# Patient Record
Sex: Male | Born: 1980 | Race: White | Hispanic: Yes | Smoking: Never smoker
Health system: Southern US, Community
[De-identification: ages and names within clinical notes are randomized; demographics above are authoritative.]

## PROBLEM LIST (undated history)

## (undated) DIAGNOSIS — I10 Essential (primary) hypertension: Secondary | ICD-10-CM

## (undated) DIAGNOSIS — A749 Chlamydial infection, unspecified: Secondary | ICD-10-CM

---

## 2007-06-27 ENCOUNTER — Ambulatory Visit: Payer: Self-pay | Admitting: Internal Medicine

## 2007-07-17 ENCOUNTER — Ambulatory Visit: Payer: Self-pay | Admitting: Family Medicine

## 2007-08-06 ENCOUNTER — Encounter: Admission: RE | Admit: 2007-08-06 | Discharge: 2007-08-07 | Payer: Self-pay | Admitting: Internal Medicine

## 2007-08-13 ENCOUNTER — Encounter: Admission: RE | Admit: 2007-08-13 | Discharge: 2007-08-21 | Payer: Self-pay | Admitting: Internal Medicine

## 2008-02-23 ENCOUNTER — Emergency Department (HOSPITAL_COMMUNITY): Admission: EM | Admit: 2008-02-23 | Discharge: 2008-02-23 | Payer: Self-pay | Admitting: Emergency Medicine

## 2008-06-26 ENCOUNTER — Emergency Department (HOSPITAL_COMMUNITY): Admission: EM | Admit: 2008-06-26 | Discharge: 2008-06-26 | Payer: Self-pay | Admitting: Emergency Medicine

## 2009-01-16 ENCOUNTER — Emergency Department (HOSPITAL_COMMUNITY): Admission: EM | Admit: 2009-01-16 | Discharge: 2009-01-17 | Payer: Self-pay | Admitting: Emergency Medicine

## 2009-04-05 ENCOUNTER — Emergency Department (HOSPITAL_COMMUNITY): Admission: EM | Admit: 2009-04-05 | Discharge: 2009-04-06 | Payer: Self-pay | Admitting: Emergency Medicine

## 2009-09-12 ENCOUNTER — Emergency Department (HOSPITAL_COMMUNITY): Admission: EM | Admit: 2009-09-12 | Discharge: 2009-09-13 | Payer: Self-pay | Admitting: Emergency Medicine

## 2010-02-05 ENCOUNTER — Emergency Department (HOSPITAL_COMMUNITY): Admission: EM | Admit: 2010-02-05 | Discharge: 2010-02-05 | Payer: Self-pay | Admitting: Emergency Medicine

## 2010-10-24 LAB — GC/CHLAMYDIA PROBE AMP, GENITAL: GC Probe Amp, Genital: NEGATIVE

## 2010-10-27 ENCOUNTER — Other Ambulatory Visit (HOSPITAL_COMMUNITY): Payer: Self-pay | Admitting: Internal Medicine

## 2010-10-27 ENCOUNTER — Ambulatory Visit (HOSPITAL_COMMUNITY)
Admission: RE | Admit: 2010-10-27 | Discharge: 2010-10-27 | Disposition: A | Discharge status: Home / Self Care | Payer: Self-pay | Source: Ambulatory Visit | Attending: Internal Medicine | Admitting: Internal Medicine

## 2010-10-27 DIAGNOSIS — R0602 Shortness of breath: Secondary | ICD-10-CM | POA: Insufficient documentation

## 2010-10-27 DIAGNOSIS — R05 Cough: Secondary | ICD-10-CM

## 2010-10-27 DIAGNOSIS — I10 Essential (primary) hypertension: Secondary | ICD-10-CM | POA: Insufficient documentation

## 2010-10-27 DIAGNOSIS — R042 Hemoptysis: Secondary | ICD-10-CM | POA: Insufficient documentation

## 2010-11-13 LAB — URINE MICROSCOPIC-ADD ON

## 2010-11-13 LAB — URINALYSIS, ROUTINE W REFLEX MICROSCOPIC
Bilirubin Urine: NEGATIVE
Glucose, UA: NEGATIVE mg/dL
Ketones, ur: NEGATIVE mg/dL
pH: 6 (ref 5.0–8.0)

## 2010-11-15 LAB — URINE CULTURE: Colony Count: 60000

## 2010-11-15 LAB — URINE MICROSCOPIC-ADD ON

## 2010-11-15 LAB — URINALYSIS, ROUTINE W REFLEX MICROSCOPIC
Bilirubin Urine: NEGATIVE
Glucose, UA: NEGATIVE mg/dL
Ketones, ur: NEGATIVE mg/dL
Nitrite: NEGATIVE
Protein, ur: 30 mg/dL — AB
Specific Gravity, Urine: 1.031 — ABNORMAL HIGH (ref 1.005–1.030)
Urobilinogen, UA: 1 mg/dL (ref 0.0–1.0)
pH: 6.5 (ref 5.0–8.0)

## 2011-05-06 LAB — URINALYSIS, ROUTINE W REFLEX MICROSCOPIC
Bilirubin Urine: NEGATIVE
Glucose, UA: NEGATIVE
Hgb urine dipstick: NEGATIVE
Ketones, ur: 15 — AB
Protein, ur: NEGATIVE

## 2013-07-15 ENCOUNTER — Emergency Department (HOSPITAL_COMMUNITY)
Admission: EM | Admit: 2013-07-15 | Discharge: 2013-07-16 | Disposition: A | Discharge status: Home / Self Care | Payer: Self-pay | Attending: Emergency Medicine | Admitting: Emergency Medicine

## 2013-07-15 ENCOUNTER — Encounter (HOSPITAL_COMMUNITY): Payer: Self-pay | Admitting: Emergency Medicine

## 2013-07-15 DIAGNOSIS — R319 Hematuria, unspecified: Secondary | ICD-10-CM | POA: Insufficient documentation

## 2013-07-15 DIAGNOSIS — Z8619 Personal history of other infectious and parasitic diseases: Secondary | ICD-10-CM | POA: Insufficient documentation

## 2013-07-15 DIAGNOSIS — R300 Dysuria: Secondary | ICD-10-CM | POA: Insufficient documentation

## 2013-07-15 LAB — URINALYSIS, ROUTINE W REFLEX MICROSCOPIC
Glucose, UA: NEGATIVE mg/dL
Nitrite: NEGATIVE
Specific Gravity, Urine: 1.013 (ref 1.005–1.030)
pH: 7 (ref 5.0–8.0)

## 2013-07-15 LAB — URINE MICROSCOPIC-ADD ON

## 2013-07-15 NOTE — ED Notes (Signed)
Pt. reports hematuria this evening with slight dysuria , pt. endorsed unprotected sex with a male that is on her period last night .

## 2013-07-16 LAB — GC/CHLAMYDIA PROBE AMP: CT Probe RNA: POSITIVE — AB

## 2013-07-16 MED ORDER — DOXYCYCLINE HYCLATE 100 MG PO CAPS: 100.0000 mg | ORAL_CAPSULE | Freq: Two times a day (BID) | ORAL | Status: DC

## 2013-07-16 MED ORDER — AZITHROMYCIN 250 MG PO TABS
1000.0000 mg | ORAL_TABLET | Freq: Once | ORAL | Status: AC
  Administered 2013-07-16: 1000 mg via ORAL
  Filled 2013-07-16: qty 4

## 2013-07-16 MED ORDER — LIDOCAINE HCL (PF) 1 % IJ SOLN
INTRAMUSCULAR | Status: AC
  Filled 2013-07-16: qty 5

## 2013-07-16 MED ORDER — CEFTRIAXONE SODIUM 1 G IJ SOLR
1.0000 g | Freq: Once | INTRAMUSCULAR | Status: AC
  Administered 2013-07-16: 1 g via INTRAMUSCULAR
  Filled 2013-07-16: qty 10

## 2013-07-16 NOTE — ED Provider Notes (Signed)
CSN: [id number]     Arrival date & time 07/15/13  2027 History   First MD Initiated Contact with Patient 07/15/13 2339     Chief Complaint  Patient presents with  . Hematuria   (Consider location/radiation/quality/duration/timing/severity/associated sxs/prior Treatment) HPI History per patient. Hematuria with slight dysuria onset tonight. One episode. No discharge. Has history of herpes and gonorrhea. Has had recent unprotected sexual intercourse. No GU lesions. No testicle pain. No penile pain. No back pain. No recent fever chills. Denies any sore throat. No rash. No history of same. No flank pain. Symptoms moderate in severity.  History reviewed. No pertinent past medical history. History reviewed. No pertinent past surgical history. No family history on file. History  Substance Use Topics  . Smoking status: Never Smoker   . Smokeless tobacco: Not on file  . Alcohol Use: No    Review of Systems  Constitutional: Negative for fever and chills.  Eyes: Negative for redness.  Respiratory: Negative for shortness of breath.   Cardiovascular: Negative for chest pain.  Gastrointestinal: Negative for vomiting and abdominal pain.  Genitourinary: Positive for dysuria and hematuria.  Musculoskeletal: Negative for back pain, neck pain and neck stiffness.  Skin: Negative for rash.  Neurological: Negative for headaches.  All other systems reviewed and are negative.    Allergies  Review of patient's allergies indicates no known allergies.  Home Medications  No current outpatient prescriptions on file. BP 157/105  Pulse 81  Temp(Src) 98.9 F (37.2 C) (Oral)  Resp 16  Ht 5\' 7"  (1.702 m)  Wt 182 lb 8 oz (82.781 kg)  BMI 28.58 kg/m2  SpO2 100% Physical Exam  Constitutional: He is oriented to person, place, and time. He appears well-developed and well-nourished.  HENT:  Head: Normocephalic and atraumatic.  Mouth/Throat: Oropharynx is clear and moist. No oropharyngeal exudate.  Eyes:  EOM are normal. Pupils are equal, round, and reactive to light.  Neck: Neck supple.  Cardiovascular: Regular rhythm and intact distal pulses.   Pulmonary/Chest: Effort normal. No respiratory distress.  Genitourinary:  Uncircumcised, no GU rash or lesions. No testicle tenderness. No penile drip.  Musculoskeletal: Normal range of motion. He exhibits no edema.  Neurological: He is alert and oriented to person, place, and time.  Skin: Skin is warm and dry. No rash noted.    ED Course  Procedures (including critical care time) Labs Review Labs Reviewed  URINALYSIS, ROUTINE W REFLEX MICROSCOPIC - Abnormal; Notable for the following:    Hgb urine dipstick LARGE (*)    Leukocytes, UA TRACE (*)    All other components within normal limits  GC/CHLAMYDIA PROBE AMP  URINE MICROSCOPIC-ADD ON  RPR  HIV ANTIBODY (ROUTINE TESTING)   GU probe sent for testing. Patient requesting HIV testing - he agrees to followup with health department in 2 days for results including RPR.  Rocephin and azithromycin provided in the ER  Urology referral provided. Prescription for antibiotics. STD precautions provided  Stable for discharge home  MDM  Diagnosis: Hematuria  32 year old male with history of STD in unprotected intercourse. No infectious symptoms. Medications provided Vital signs and nurse's notes reviewed and considered  Sunnie Nielsen, MD 07/16/13 [phone]

## 2013-07-18 NOTE — ED Notes (Signed)
+   Chlamydia Patient treated with Rocephin And Zithromax-[facility] faxed 

## 2013-07-19 ENCOUNTER — Telehealth (HOSPITAL_COMMUNITY): Payer: Self-pay | Admitting: Emergency Medicine

## 2014-08-16 ENCOUNTER — Encounter (HOSPITAL_COMMUNITY): Payer: Self-pay | Admitting: *Deleted

## 2014-08-16 ENCOUNTER — Emergency Department (HOSPITAL_COMMUNITY)
Admission: EM | Admit: 2014-08-16 | Discharge: 2014-08-16 | Disposition: A | Discharge status: Home / Self Care | Payer: Self-pay | Attending: Emergency Medicine | Admitting: Emergency Medicine

## 2014-08-16 DIAGNOSIS — Z711 Person with feared health complaint in whom no diagnosis is made: Secondary | ICD-10-CM

## 2014-08-16 DIAGNOSIS — R369 Urethral discharge, unspecified: Secondary | ICD-10-CM | POA: Insufficient documentation

## 2014-08-16 DIAGNOSIS — R300 Dysuria: Secondary | ICD-10-CM | POA: Insufficient documentation

## 2014-08-16 DIAGNOSIS — Z202 Contact with and (suspected) exposure to infections with a predominantly sexual mode of transmission: Secondary | ICD-10-CM | POA: Insufficient documentation

## 2014-08-16 DIAGNOSIS — Z792 Long term (current) use of antibiotics: Secondary | ICD-10-CM | POA: Insufficient documentation

## 2014-08-16 MED ORDER — CEFTRIAXONE SODIUM 250 MG IJ SOLR
250.0000 mg | Freq: Once | INTRAMUSCULAR | Status: AC
  Administered 2014-08-16: 250 mg via INTRAMUSCULAR
  Filled 2014-08-16: qty 250

## 2014-08-16 MED ORDER — LIDOCAINE HCL (PF) 1 % IJ SOLN
INTRAMUSCULAR | Status: AC
  Administered 2014-08-16: 0.9 mL
  Filled 2014-08-16: qty 5

## 2014-08-16 MED ORDER — AZITHROMYCIN 250 MG PO TABS
1000.0000 mg | ORAL_TABLET | Freq: Once | ORAL | Status: AC
  Administered 2014-08-16: 1000 mg via ORAL
  Filled 2014-08-16: qty 4

## 2014-08-16 NOTE — ED Notes (Signed)
Pt in c/o penile discharge and pain with urination, concerned he has a STD

## 2014-08-16 NOTE — ED Provider Notes (Signed)
CSN: [id number]     Arrival date & time 08/16/14  1756 History  This chart was scribed for Cameron Wise, working with Juliet RudeNathan R. Rubin PayorPickering, MD, by Roxy Cedarhandni Bhalodia ED Scribe. This patient was seen in room TR11C/TR11C and the patient's care was started at 7:31 PM  Chief Complaint  Patient presents with  . SEXUALLY TRANSMITTED DISEASE   The history is provided by the patient. No language interpreter was used.   HPI Comments: Cameron Wise is a 34 y.o. male who presents to the Emergency Department complaining of moderate dysuria and penile discharge that began 1 week ago. Patient is concerned that he may have a STD. Patient states that he had unprotected sex recently. Patient has no known allergies. Denies fever, chills, n/v/d.  History reviewed. No pertinent past medical history. History reviewed. No pertinent past surgical history. History reviewed. No pertinent family history. History  Substance Use Topics  . Smoking status: Never Smoker   . Smokeless tobacco: Not on file  . Alcohol Use: No   Review of Systems  Genitourinary: Positive for dysuria and discharge.  All other systems reviewed and are negative.  Allergies  Review of patient's allergies indicates no known allergies.  Home Medications   Prior to Admission medications   Medication Sig Start Date End Date Taking? Authorizing Provider  doxycycline (VIBRAMYCIN) 100 MG capsule Take 1 capsule (100 mg total) by mouth 2 (two) times daily. 07/16/13   Sunnie NielsenBrian Opitz, MD   Triage Vitals: BP 157/105 mmHg  Pulse 90  Temp(Src) 98.3 F (36.8 C) (Oral)  Resp 18  SpO2 97%  Physical Exam  Constitutional: He appears well-developed and well-nourished.  HENT:  Head: Normocephalic and atraumatic.  Eyes: Conjunctivae are normal. No scleral icterus.  Neck: Normal range of motion.  Cardiovascular: Normal rate, regular rhythm and normal heart sounds.   Pulmonary/Chest: Effort normal and breath sounds normal. No respiratory distress. He has  no wheezes. He has no rales. He exhibits no tenderness.  Abdominal: Soft. Bowel sounds are normal. He exhibits no distension and no mass. There is no tenderness. There is no rebound and no guarding.  Genitourinary:  Chaperoned exam. Penis is uncircumcised. No erythema or rashes. No discharge.  Musculoskeletal: Normal range of motion.  Neurological: He is alert.  Skin: Skin is warm and dry.  Nursing note and vitals reviewed.  ED Course  Procedures (including critical care time)  DIAGNOSTIC STUDIES: Oxygen Saturation is 97% on RA, adequate by my interpretation.    COORDINATION OF CARE:  Pt advised of plan for treatment and pt agrees.  Labs Review Labs Reviewed  GC/CHLAMYDIA PROBE AMP  HIV ANTIBODY (ROUTINE TESTING)  RPR   Imaging Review No results found.   EKG Interpretation None     MDM   Final diagnoses:  Concern about STD in male without diagnosis  Penile discharge  Dysuria    Pt tx empirically for STD. Home care instructions provided. Return precautions provided. Pt verbalized understanding and agreement with tx plan.   I personally performed the services described in this documentation, which was scribed in my presence. The recorded information has been reviewed and is accurate.  Cameron Finnerrin O'Malley, PA-C 08/16/14 2102  Juliet RudeNathan R. Rubin PayorPickering, MD 08/16/14 [phone]

## 2014-08-18 LAB — GC/CHLAMYDIA PROBE AMP
CT Probe RNA: NEGATIVE
GC Probe RNA: NEGATIVE

## 2014-08-18 LAB — RPR: RPR Ser Ql: NONREACTIVE

## 2014-08-18 LAB — HIV ANTIBODY (ROUTINE TESTING W REFLEX)
HIV 1/O/2 Abs-Index Value: <1 (ref <1.00)
HIV-1/HIV-2 Ab: NONREACTIVE

## 2014-09-06 ENCOUNTER — Encounter (HOSPITAL_COMMUNITY): Payer: Self-pay | Admitting: *Deleted

## 2014-09-06 ENCOUNTER — Emergency Department (HOSPITAL_COMMUNITY)
Admission: EM | Admit: 2014-09-06 | Discharge: 2014-09-06 | Disposition: A | Discharge status: Home / Self Care | Payer: Self-pay | Attending: Emergency Medicine | Admitting: Emergency Medicine

## 2014-09-06 DIAGNOSIS — Z202 Contact with and (suspected) exposure to infections with a predominantly sexual mode of transmission: Secondary | ICD-10-CM | POA: Insufficient documentation

## 2014-09-06 DIAGNOSIS — Z792 Long term (current) use of antibiotics: Secondary | ICD-10-CM | POA: Insufficient documentation

## 2014-09-06 DIAGNOSIS — Z711 Person with feared health complaint in whom no diagnosis is made: Secondary | ICD-10-CM

## 2014-09-06 LAB — URINALYSIS, ROUTINE W REFLEX MICROSCOPIC
Bilirubin Urine: NEGATIVE
Glucose, UA: NEGATIVE mg/dL
Hgb urine dipstick: NEGATIVE
Ketones, ur: NEGATIVE mg/dL
LEUKOCYTES UA: NEGATIVE
NITRITE: NEGATIVE
Protein, ur: NEGATIVE mg/dL
SPECIFIC GRAVITY, URINE: 1.028 (ref 1.005–1.030)
Urobilinogen, UA: 1 mg/dL (ref 0.0–1.0)
pH: 7 (ref 5.0–8.0)

## 2014-09-06 MED ORDER — CEFTRIAXONE SODIUM 250 MG IJ SOLR
250.0000 mg | Freq: Once | INTRAMUSCULAR | Status: AC
  Administered 2014-09-06: 250 mg via INTRAMUSCULAR
  Filled 2014-09-06: qty 250

## 2014-09-06 MED ORDER — AZITHROMYCIN 250 MG PO TABS
1000.0000 mg | ORAL_TABLET | Freq: Once | ORAL | Status: AC
  Administered 2014-09-06: 1000 mg via ORAL
  Filled 2014-09-06: qty 4

## 2014-09-06 MED ORDER — LIDOCAINE HCL (PF) 1 % IJ SOLN
INTRAMUSCULAR | Status: AC
  Administered 2014-09-06: 1 mL
  Filled 2014-09-06: qty 5

## 2014-09-06 NOTE — Discharge Instructions (Signed)
Safe Sex Safe sex is about reducing the risk of giving or getting a sexually transmitted disease (STD). STDs are spread through sexual contact involving the genitals, mouth, or rectum. Some STDs can be cured and others cannot. Safe sex can also prevent unintended pregnancies.  WHAT ARE SOME SAFE SEX PRACTICES?  Limit your sexual activity to only one partner who is having sex with only you.  Talk to your partner about his or her past partners, past STDs, and drug use.  Use a condom every time you have sexual intercourse. This includes vaginal, oral, and anal sexual activity. Both females and males should wear condoms during oral sex. Only use latex or polyurethane condoms and water-based lubricants. Using petroleum-based lubricants or oils to lubricate a condom will weaken the condom and increase the chance that it will break. The condom should be in place from the beginning to the end of sexual activity. Wearing a condom reduces, but does not completely eliminate, your risk of getting or giving an STD. STDs can be spread by contact with infected body fluids and skin.  Get vaccinated for hepatitis B and HPV.  Avoid alcohol and recreational drugs, which can affect your judgment. You may forget to use a condom or participate in high-risk sex.  For females, avoid douching after sexual intercourse. Douching can spread an infection farther into the reproductive tract.  Check your body for signs of sores, blisters, rashes, or unusual discharge. See your health care provider if you notice any of these signs.  Avoid sexual contact if you have symptoms of an infection or are being treated for an STD. If you or your partner has herpes, avoid sexual contact when blisters are present. Use condoms at all other times.  If you are at risk of being infected with HIV, it is recommended that you take a prescription medicine daily to prevent HIV infection. This is called pre-exposure prophylaxis (PrEP). You are  considered at risk if:  You are a man who has sex with other men (MSM).  You are a heterosexual man or woman who is sexually active with more than one partner.  You take drugs by injection.  You are sexually active with a partner who has HIV.  Talk with your health care provider about whether you are at high risk of being infected with HIV. If you choose to begin PrEP, you should first be tested for HIV. You should then be tested every 3 months for as long as you are taking PrEP.  See your health care provider for regular screenings, exams, and tests for other STDs. Before having sex with a new partner, each of you should be screened for STDs and should talk about the results with each other. WHAT ARE THE BENEFITS OF SAFE SEX?   There is less chance of getting or giving an STD.  You can prevent unwanted or unintended pregnancies.  By discussing safe sex concerns with your partner, you may increase feelings of intimacy, comfort, trust, and honesty between the two of you. Document Released: 09/01/2004 Document Revised: 12/09/2013 Document Reviewed: 01/16/2012 [facility] Patient Information 2015 [place], [place]. This information is not intended to replace advice given to you by your health care provider. Make sure you discuss any questions you have with your health care provider.  Sexo seguro (Safe Sex) El sexo seguro implica reducir el riesgo de transmitir o contagiarse enfermedades de transmisin sexual (ETS). Las ETS se transmiten por contacto sexual con los genitales, la boca o el recto.  Algunas ETS pueden curarse, otras no. El sexo seguro tambin puede prevenir los embarazos no deseados.  CULES SON ALGUNAS DE LAS PRCTICAS DE SEXO SEGURO?  Limite las [address] Placeactividades sexuales a una sola pareja, que tenga relaciones sexuales solamente con usted.  Hable con su pareja sobre sus parejas pasadas, antecedentes de ETS y el uso de drogas.  Use un condn cada vez que tenga The [place] Paul Travelersrelaciones sexuales.  Esto abarca la actividad sexual vaginal, oral y anal. Tanto los hombres como las mujeres deben usar condn durante el sexo oral. Use solamente condones de ltex o poliuretano y lubricantes a base de agua. La utilizacin de aceites o lubricantes a base de vaselina para lubricar el condn podran debilitarlo y [address] Dra[address]ar las posibilidades de que se rompa. El condn debe estar colocado desde el principio hasta el final de la actividad sexual. Usar un condn reduce el riesgo de transmitir o contagiarse una ETS, pero no lo elimina por completo. Las ETS pueden trasmitirse por contacto con los fluidos corporales y la piel infectados.  Aplquese las vacunas para la hepatitisB y [place] [facility].  Evite el uso de alcohol y drogas porque pueden afectar su criterio. Tal vez se olvide de usar un condn o participe en actividades sexuales de alto riesgo.  En el caso de las mujeres, deben evitar las duchas vaginales despus de Warehouse managertener relaciones sexuales. Las duchas vaginales pueden propagar una infeccin a la profundidad del tracto reproductivo.  Revsese el cuerpo para ver si tiene signos de llagas, ampollas, erupciones o secreciones inusuales. Consulte a su mdico si observa alguno de estos signos.  Evite el contacto sexual si tiene sntomas de infeccin o est recibiendo tratamiento para una ETS. Si usted o su pareja tiene herpes, evite el contacto sexual mientras tenga ampollas. Use condones en cualquier otro momento.  Si tiene riesgo de infectarse por el VIH, se recomienda tomar diariamente un medicamento recetado para evitar la infeccin. Esto se conoce como profilaxis previa a la exposicin. Se considera que est en riesgo si:  Es un hombre que tiene sexo con otros hombres.  Es heterosexual y es activo sexualmente con ms de una pareja.  Se inyecta drogas.  Es [place]ivo sexualmente con Neomia Dearuna pareja que tiene VIH.  Consulte a su mdico para saber si tiene un alto riesgo de infectarse por el VIH. Si opta por comenzar la  profilaxis previa a la exposicin, primero debe realizarse anlisis de deteccin del VIH. Luego, le harn anlisis cada 3meses mientras est tomando los medicamentos para la profilaxis previa a la exposicin.  Visite al mdico en forma regular para hacerse anlisis de deteccin, exmenes y pruebas para otras ETS. Antes de Washington Mutualtener relaciones sexuales con una nueva pareja, ambos deben hacerse un anlisis de deteccin de ETS y deben Quest Diagnosticsconversar sobre los resultados. CULES SON LOS BENEFICIOS DEL SEXO SEGURO?   Existe una menor probabilidad de contagiarse o transmitir una ETS.  Puede prevenir los embarazos no deseados.  Al hablar Target Corporationsobre el sexo seguro con su pareja, puede incrementar la sensacin de intimidad, comodidad, confianza y Northrop Grummanhonestidad entre ambos. Document Released: 07/25/2005 Document Revised: 07/30/2013 [facility] Patient Information 2015 [place], [place]. This information is not intended to replace advice given to you by your health care provider. Make sure you discuss any questions you have with your health care provider.

## 2014-09-06 NOTE — ED Notes (Signed)
Pt in c/o penile discharge and odor, no distress noted

## 2014-09-06 NOTE — ED Provider Notes (Signed)
CSN: [id number]     Arrival date & time 09/06/14  2034 History  This chart was scribed for non-physician practitioner, Cathlin Buchan Irine Seal PA-C, working with Samuel Jester, DO by Freida Busman, ED Scribe. This patient was seen in room TR08C/TR08C and the patient's care was started at 9:35 PM.    Chief Complaint  Patient presents with  . Penile Discharge     The history is provided by the patient. No language interpreter was used.     HPI Comments:  Cameron Wise is a 34 y.o. male who presents to the Emergency Department complaining of clear penile discharge that started yesterday. He notes discharge has a foul order . Pt is sexually active with male partners but was not using protection. Pt states he has been sleeping with the wrong kind of women. He denies dysuria, abdominal pain, fever and rash. No alleviating factors noted or alleviating factors noted.   History reviewed. No pertinent past medical history. History reviewed. No pertinent past surgical history. History reviewed. No pertinent family history. History  Substance Use Topics  . Smoking status: Never Smoker   . Smokeless tobacco: Not on file  . Alcohol Use: No    Review of Systems  Constitutional: Negative for fever and chills.  Genitourinary: Positive for discharge. Negative for dysuria and scrotal swelling.  All other systems reviewed and are negative.   Allergies  Review of patient's allergies indicates no known allergies.  Home Medications   Prior to Admission medications   Medication Sig Start Date End Date Taking? Authorizing Provider  doxycycline (VIBRAMYCIN) 100 MG capsule Take 1 capsule (100 mg total) by mouth 2 (two) times daily. 07/16/13   Sunnie Nielsen, MD   BP 150/107 mmHg  Pulse 86  Temp(Src) 99 F (37.2 C)  Resp 16  SpO2 97% Physical Exam  Constitutional: He is oriented to person, place, and time. He appears well-developed and well-nourished.  HENT:  Head: Normocephalic and atraumatic.   Cardiovascular: Normal rate.   Pulmonary/Chest: Effort normal.  Abdominal: Bowel sounds are normal. He exhibits no distension. There is no tenderness. There is no rigidity, no rebound, no guarding and no CVA tenderness.  Genitourinary: Testes normal and penis normal. Right testis shows no swelling and no tenderness. Left testis shows no swelling and no tenderness. Uncircumcised.  + clear discharge with foul odor  Neurological: He is alert and oriented to person, place, and time.  Skin: Skin is warm and dry. No rash noted.  Psychiatric: He has a normal mood and affect.  Nursing note and vitals reviewed.   ED Course  Procedures   DIAGNOSTIC STUDIES:  Oxygen Saturation is 97% on RA, normal by my interpretation.    COORDINATION OF CARE:  9:38 PM Discussed treatment plan with pt at bedside and pt agreed to plan.  Labs Review Labs Reviewed  URINALYSIS, ROUTINE W REFLEX MICROSCOPIC  RPR  HIV ANTIBODY (ROUTINE TESTING)  GC/CHLAMYDIA PROBE AMP ([place])    Imaging Review No results found.   EKG Interpretation None      MDM   Final diagnoses:  Concern about STD in male without diagnosis    Medications  azithromycin (ZITHROMAX) tablet 1,000 mg (not administered)  cefTRIAXone (ROCEPHIN) injection 250 mg (not administered)   Advised to practice safe sex and have all partners evaluated and treated at the local health department. Also advised to follow with the health Department in 1-2 weeks to confirm effectiveness of treatment and receive additional education/evaluation. Return precautions given.  33  y.o.[name]'s evaluation in the Emergency Department is complete. It has been determined that no acute conditions requiring further emergency intervention are present at this time. The patient/guardian have been advised of the diagnosis and plan. We have discussed signs and symptoms that warrant return to the ED, such as changes or worsening in symptoms.  Vital signs  are stable at discharge. Filed Vitals:   09/06/14 2043  BP: 150/107  Pulse: 86  Temp: 99 F (37.2 C)  Resp: 16    Patient/guardian has voiced understanding and agreed to follow-up with the PCP or specialist.  I personally performed the services described in this documentation, which was scribed in my presence. The recorded information has been reviewed and is accurate.    Dorthula Matasiffany G Jerard Bays, PA-C 09/06/14 2202  Samuel JesterKathleen McManus, DO 09/08/14 1344

## 2014-09-08 LAB — GC/CHLAMYDIA PROBE AMP (~~LOC~~) NOT AT ARMC
CHLAMYDIA, DNA PROBE: NEGATIVE
Neisseria Gonorrhea: NEGATIVE

## 2014-09-09 LAB — HIV ANTIBODY (ROUTINE TESTING W REFLEX): HIV SCREEN 4TH GENERATION: NONREACTIVE

## 2014-09-09 LAB — RPR: RPR Ser Ql: NONREACTIVE

## 2015-01-28 DIAGNOSIS — R300 Dysuria: Secondary | ICD-10-CM | POA: Insufficient documentation

## 2015-01-28 DIAGNOSIS — R21 Rash and other nonspecific skin eruption: Secondary | ICD-10-CM | POA: Insufficient documentation

## 2015-01-28 DIAGNOSIS — Z792 Long term (current) use of antibiotics: Secondary | ICD-10-CM | POA: Insufficient documentation

## 2015-01-28 DIAGNOSIS — L739 Follicular disorder, unspecified: Secondary | ICD-10-CM | POA: Insufficient documentation

## 2015-01-28 DIAGNOSIS — Z202 Contact with and (suspected) exposure to infections with a predominantly sexual mode of transmission: Secondary | ICD-10-CM | POA: Insufficient documentation

## 2015-01-29 ENCOUNTER — Telehealth (HOSPITAL_BASED_OUTPATIENT_CLINIC_OR_DEPARTMENT_OTHER): Payer: Self-pay | Admitting: Emergency Medicine

## 2015-01-29 ENCOUNTER — Emergency Department (HOSPITAL_COMMUNITY)
Admission: EM | Admit: 2015-01-29 | Discharge: 2015-01-29 | Disposition: A | Discharge status: Home / Self Care | Payer: Self-pay | Attending: Emergency Medicine | Admitting: Emergency Medicine

## 2015-01-29 ENCOUNTER — Encounter (HOSPITAL_COMMUNITY): Payer: Self-pay | Admitting: *Deleted

## 2015-01-29 DIAGNOSIS — Z711 Person with feared health complaint in whom no diagnosis is made: Secondary | ICD-10-CM

## 2015-01-29 LAB — RPR: RPR: NONREACTIVE

## 2015-01-29 LAB — URINALYSIS, ROUTINE W REFLEX MICROSCOPIC
Bilirubin Urine: NEGATIVE
Glucose, UA: NEGATIVE mg/dL
Hgb urine dipstick: NEGATIVE
Ketones, ur: NEGATIVE mg/dL
LEUKOCYTES UA: NEGATIVE
Nitrite: NEGATIVE
PH: 5 (ref 5.0–8.0)
PROTEIN: NEGATIVE mg/dL
Specific Gravity, Urine: 1.029 (ref 1.005–1.030)
Urobilinogen, UA: 0.2 mg/dL (ref 0.0–1.0)

## 2015-01-29 LAB — HIV ANTIBODY (ROUTINE TESTING W REFLEX): HIV Screen 4th Generation wRfx: NONREACTIVE

## 2015-01-29 LAB — URINE MICROSCOPIC-ADD ON

## 2015-01-29 LAB — GC/CHLAMYDIA PROBE AMP (~~LOC~~) NOT AT ARMC
CHLAMYDIA, DNA PROBE: NEGATIVE
NEISSERIA GONORRHEA: NEGATIVE

## 2015-01-29 MED ORDER — LIDOCAINE HCL (PF) 1 % IJ SOLN
0.9000 mL | Freq: Once | INTRAMUSCULAR | Status: AC
  Administered 2015-01-29: 0.9 mL
  Filled 2015-01-29: qty 5

## 2015-01-29 MED ORDER — METRONIDAZOLE 500 MG PO TABS
2000.0000 mg | ORAL_TABLET | Freq: Once | ORAL | Status: AC
  Administered 2015-01-29: 2000 mg via ORAL
  Filled 2015-01-29: qty 4

## 2015-01-29 MED ORDER — CEFTRIAXONE SODIUM 250 MG IJ SOLR
250.0000 mg | Freq: Once | INTRAMUSCULAR | Status: AC
  Administered 2015-01-29: 250 mg via INTRAMUSCULAR
  Filled 2015-01-29: qty 250

## 2015-01-29 MED ORDER — AZITHROMYCIN 250 MG PO TABS
1000.0000 mg | ORAL_TABLET | Freq: Once | ORAL | Status: AC
  Administered 2015-01-29: 1000 mg via ORAL
  Filled 2015-01-29: qty 4

## 2015-01-29 NOTE — ED Notes (Signed)
Pt reports white bumps around his penis and a watery discharge. Pt states he had unprotected sex a week and half ago. Pt also recently shaved his pubic area four days ago.

## 2015-01-29 NOTE — ED Notes (Signed)
Patient is alert and orientedx4.  Patient was explained discharge instructions and they understood them with no questions.   

## 2015-01-29 NOTE — Discharge Instructions (Signed)
1. Medications: usual home medications 2. Treatment: rest, drink plenty of fluids,  3. Follow Up: Please followup with your primary doctor in 2-3 days for discussion of your diagnoses and further evaluation after today's visit; if you do not have a primary care doctor use the resource guide provided to find one; Please return to the ER for worsening symptoms, fever, other concerns   Sexually Transmitted Disease A sexually transmitted disease (STD) is a disease or infection that may be passed (transmitted) from person to person, usually during sexual activity. This may happen by way of saliva, semen, blood, vaginal mucus, or urine. Common STDs include:   Gonorrhea.   Chlamydia.   Syphilis.   HIV and AIDS.   Genital herpes.   Hepatitis B and C.   Trichomonas.   Human papillomavirus (HPV).   Pubic lice.   Scabies.  Mites.  Bacterial vaginosis. WHAT ARE CAUSES OF STDs? An STD may be caused by bacteria, a virus, or parasites. STDs are often transmitted during sexual activity if one person is infected. However, they may also be transmitted through nonsexual means. STDs may be transmitted after:   Sexual intercourse with an infected person.   Sharing sex toys with an infected person.   Sharing needles with an infected person or using unclean piercing or tattoo needles.  Having intimate contact with the genitals, mouth, or rectal areas of an infected person.   Exposure to infected fluids during birth. WHAT ARE THE SIGNS AND SYMPTOMS OF STDs? Different STDs have different symptoms. Some people may not have any symptoms. If symptoms are present, they may include:   Painful or bloody urination.   Pain in the pelvis, abdomen, vagina, anus, throat, or eyes.   A skin rash, itching, or irritation.  Growths, ulcerations, blisters, or sores in the genital and anal areas.  Abnormal vaginal discharge with or without bad odor.   Penile discharge in men.   Fever.    Pain or bleeding during sexual intercourse.   Swollen glands in the groin area.   Yellow skin and eyes (jaundice). This is seen with hepatitis.   Swollen testicles.  Infertility.  Sores and blisters in the mouth. HOW ARE STDs DIAGNOSED? To make a diagnosis, your health care provider may:   Take a medical history.   Perform a physical exam.   Take a sample of any discharge to examine.  Swab the throat, cervix, opening to the penis, rectum, or vagina for testing.  Test a sample of your first morning urine.   Perform blood tests.   Perform a Pap test, if this applies.   Perform a colposcopy.   Perform a laparoscopy.  HOW ARE STDs TREATED? Treatment depends on the STD. Some STDs may be treated but not cured.   Chlamydia, gonorrhea, trichomonas, and syphilis can be cured with antibiotic medicine.   Genital herpes, hepatitis, and HIV can be treated, but not cured, with prescribed medicines. The medicines lessen symptoms.   Genital warts from HPV can be treated with medicine or by freezing, burning (electrocautery), or surgery. Warts may come back.   HPV cannot be cured with medicine or surgery. However, abnormal areas may be removed from the cervix, vagina, or vulva.   If your diagnosis is confirmed, your recent sexual partners need treatment. This is true even if they are symptom-free or have a negative culture or evaluation. They should not have sex until their health care providers say it is okay. HOW CAN I REDUCE MY RISK OF  GETTING AN STD? Take these steps to reduce your risk of getting an STD:  Use latex condoms, dental dams, and water-soluble lubricants during sexual activity. Do not use petroleum jelly or oils.  Avoid having multiple sex partners.  Do not have sex with someone who has other sex partners.  Do not have sex with anyone you do not know or who is at high risk for an STD.  Avoid risky sex practices that can break your skin.  Do  not have sex if you have open sores on your mouth or skin.  Avoid drinking too much alcohol or taking illegal drugs. Alcohol and drugs can affect your judgment and put you in a vulnerable position.  Avoid engaging in oral and anal sex acts.  Get vaccinated for HPV and hepatitis. If you have not received these vaccines in the past, talk to your health care provider about whether one or both might be right for you.   If you are at risk of being infected with HIV, it is recommended that you take a prescription medicine daily to prevent HIV infection. This is called pre-exposure prophylaxis (PrEP). You are considered at risk if:  You are a man who has sex with other men (MSM).  You are a heterosexual man or woman and are sexually active with more than one partner.  You take drugs by injection.  You are sexually active with a partner who has HIV.  Talk with your health care provider about whether you are at high risk of being infected with HIV. If you choose to begin PrEP, you should first be tested for HIV. You should then be tested every 3 months for as long as you are taking PrEP.  WHAT SHOULD I DO IF I THINK I HAVE AN STD?  See your health care provider.   Tell your sexual partner(s). They should be tested and treated for any STDs.  Do not have sex until your health care provider says it is okay. WHEN SHOULD I GET IMMEDIATE MEDICAL CARE? Contact your health care provider right away if:   You have severe abdominal pain.  You are a man and notice swelling or pain in your testicles.  You are a woman and notice swelling or pain in your vagina. Document Released: 10/15/2002 Document Revised: 07/30/2013 Document Reviewed: 02/12/2013 [facility] Patient Information 2015 [place], [place]. This information is not intended to replace advice given to you by your health care provider. Make sure you discuss any questions you have with your health care provider.    Emergency Department  Resource Guide 1) Find a Doctor and Pay Out of Pocket Although you won't have to find out who is covered by your insurance plan, it is a good idea to ask around and get recommendations. You will then need to call the office and see if the doctor you have chosen will accept you as a new patient and what types of options they offer for patients who are self-pay. Some doctors offer discounts or will set up payment plans for their patients who do not have insurance, but you will need to ask so you aren't surprised when you get to your appointment.  2) Contact Your Local Health Department Not all health departments have doctors that can see patients for sick visits, but many do, so it is worth a call to see if yours does. If you don't know where your local health department is, you can check in your phone book. The CDC also has  a tool to help you locate your state's health department, and many state websites also have listings of all of their local health departments.  3) Find a Walk-in Clinic If your illness is not likely to be very severe or complicated, you may want to try a walk in clinic. These are popping up all over the country in pharmacies, drugstores, and shopping centers. They're usually staffed by nurse practitioners or physician assistants that have been trained to treat common illnesses and complaints. They're usually fairly quick and inexpensive. However, if you have serious medical issues or chronic medical problems, these are probably not your best option.  No Primary Care Doctor: - Call Health Connect at  [phone] - they can help you locate a primary care doctor that  accepts your insurance, provides certain services, etc. - Physician Referral Service- [phone]  Chronic Pain Problems: Organization         Address  Phone   Notes  [facility]  [phone] Patients need to be referred by their primary care doctor.   Medication Assistance: Organization          Address  Phone   Notes  [facility] Medication [facility] [address] [place] [place], [place] [address] [phone] --Must be a resident of [facility] [place] -- Must have NO insurance coverage whatsoever (no Medicaid/ Medicare, etc.) -- The pt. MUST have a primary care doctor that directs their care regularly and follows them in the community   [facility]  [phone]   [employer]  [phone]    Agencies that provide inexpensive medical care: Organization         Address  Phone   Notes  [facility]  [phone]   [facility]    [phone]   [facility] [address] [place], [place] [address] [phone]   [facility] [address] [place], [place] [phone]   Planned Parenthood    [phone]   [facility]    [phone]   [facility]  [address], [place] Phone:  [phone], Fax:  [phone] Hours of Operation:  9 am - 6 pm, M-F.  Also accepts Medicaid/Medicare and self-pay.  [facility]  [address], [place] Phone: [phone], Fax: [phone]. Hours of Operation:  8:30 am - 5:30 pm, M-F.  Also accepts Medicaid and self-pay.  [facility] [place] [address], [place] Phone: [phone]   [facility] [address] Natasha Bence [place], [place] [phone], Ext. [phone] Mondays & Thursdays: 7-9 AM.  First 15 patients are seen on a first come, first serve basis.    Medicaid-accepting [facility] Providers:  Organization         Address  Phone   Notes  [facility] [address], [place] [phone] Also accepts self-pay patients.  [facility] [address] Laurell Josephs [place], [place]  [phone]   [facility] [address], [place] [phone]   [facility] [address], [place] [phone]   Renaye Rakers [address], [place]   [phone] Only accepts [place] Access [place] patients after they have their name applied to their card.   Self-Pay (no insurance) in [place]:  Organization         Address  Phone   Notes  Sickle Cell Patients, [facility] [address] [place], [place] [phone]   [facility] [address] [place], [place] [phone]   [facility]  [address], [place] [phone]   [facility]/Dr. Osei-Bonsu  [address], [place] or [address], [place] [phone] Phone number for both [place] and [place] locations is the same.  [facility] and [facility] [address], [place] [phone]   [facility] [address] [place] or [address] [phone]   [facility] [address] [place] [phone], phone; [phone], fax Sees patients 1st and 3rd Saturday of every month.  Must not qualify for public or private insurance (i.e. Medicaid, Medicare, [place] Health Choice, Veterans' Benefits)  Household income should be no more than 200% of the poverty level The clinic cannot treat you if you are pregnant or think you are pregnant  Sexually transmitted diseases are not treated at the clinic.    Dental Care: Organization         Address  Phone  Notes  [facility] Department of [facility] [address] [place], [place] [phone] Accepts children up to age 51 who are enrolled in [place] or [place] Health Choice; pregnant women with a Medicaid card; and children who have applied for Medicaid or [place] Health Choice, but were declined, whose parents can pay a reduced fee at time of service.  [facility] Department of [facility]  [address], [place] [phone] Accepts children up to age 42 who are enrolled in [place] or [place] Health Choice; pregnant women with a Medicaid card; and children who have applied for Medicaid or  Health Choice, but were declined, whose parents can pay a reduced fee at time of service.  [facility]  [address] [place], [place] [phone] Patients are seen by appointment only. Walk-ins are not accepted. [facility] will see patients 69 years of age and older. Monday - Tuesday (8am-5pm) Most Wednesdays (8:30-5pm) $30 per visit, cash only  [facility]  [address], [facility] [phone] Patients are seen by appointment only. Walk-ins are not accepted. [facility] will see patients 105 years of age and older. One Wednesday Evening (Monthly: Volunteer Based).  $30 per visit, cash only  [employer]  [phone] for adults; Children under age 62, call Graduate Pediatric Dentistry at [phone]. Children aged 71-14, please call [phone] to request a pediatric application.  Dental services are provided in all areas of dental care including fillings, crowns and bridges, complete and partial dentures, implants, gum treatment, root canals, and extractions. Preventive care is also provided. Treatment is provided to both adults and children. Patients are selected via a lottery and there is often a waiting list.   [facility] [address], [place]  [phone] [email or web address]   [facility] [address] [place], [place] [phone], Ext. [phone] Second and Fourth Thursday of each month, opens at 6:30 AM; Clinic ends at 9 AM.  Patients are seen on a first-come first-served basis, and a limited number are seen during each clinic.  [facility]  [address] Ether Griffins [place], [place] [phone]   Eligibility Requirements You must  have lived in [place], [place], or [place] counties for at least the last three months.   You cannot be eligible for state or federal sponsored [place], including [employer], [place], or [employer].   You generally cannot be eligible for healthcare insurance through your employer.    How to apply: Eligibility screenings are held every Tuesday and Wednesday afternoon from 1:00 pm until 4:00 pm. You do not need an appointment for the interview!  [facility] [address], [place], [place] [phone]   [facility] Health Department  [phone]   [facility]  [phone]   [facility] Health Department  [phone]    Behavioral Health Resources in the Community: Intensive Outpatient Programs Organization         Address  Phone  Notes  [facility] [address] [place], [place] [phone]   [facility]patient [address] [place], [place] [phone]   [facility] [address] [place], [place]  [phone]   [facility] [address],  [place], [place] [phone] or [phone]   Substance Abuse Resources Organization         Address  Phone  Notes  [facility]  [phone]   [facility]  [phone]   [facility] [place]  [phone]   Floydene Flock  [phone]   Residential & Outpatient Substance Abuse Program  [phone]   Psychological Services Organization         Address  Phone  Notes  [facility] Behavioral Health  [phone]   [facility] Services  [phone]   [facility] [address], [place] [phone] or [phone]    Mobile Crisis Teams Organization         Address  Phone  Notes  [facility], Mobile Crisis Care Unit  [phone]   [facility]  [address] [place], [place] [phone]   Doristine Locks [address] [place] [phone]    Self-Help/Support Groups Organization         Address  Phone             Notes  [facility] [place] - variety of support groups  336- I7437963 Call for more information  Narcotics Anonymous (NA), [facility] [address], [employer] [place]  2 meetings at this location   Statistician         Address  Phone  Notes  [facility] [address],    [place]  [phone]   [facility]  [address], [place] [address], [place], [place] [phone]   [facility] [address] [place], [place] [phone] Admissions: 8am-3pm M-F  [facility] [address]    [place], [place] [phone]   [facility] [address] Starling Manns [place], [place] [phone]   [facility] [address],  [place], [place] [phone]   [facility] - Intensive Outpatient [address] Laurell Josephs [address], [place], [place] [phone]   [facility] ([facility] [address] [place]  [place], [place] [phone] or [phone]   [facility] ([facility]) [address] [place], [place] [phone] Accepts Medicaid  Fellowship Orick [address],  [place] [phone] Substance Abuse/Addiction  Treatment   [facility] Organization         Address  Phone  Notes  [facility]  [phone]   Angie Fava, PhD [address] Ervin Knack [place], [place]   [phone] or [phone]   [facility]   [address] [place], [place] [phone]   [facility] [address] [place], [place] [phone] Insurance/Medicaid/sponsorship through [facility] and Families [address]                                    [place], [place] [phone] Therapy/tele-psych/case  [facility] [address], [place] [phone]    Dr. Lolly Mustache  [phone]   [facility]  [employer] [facility] Dept. 1) [address] [place] 2) [address] [place] 3)  [address], [place] [phone]  [phone]   [facility] Child Abuse Hotline [phone] or [phone] (After Hours)

## 2015-01-29 NOTE — ED Provider Notes (Signed)
CSN: [id number]     Arrival date & time 01/28/15  2331 History   First MD Initiated Contact with Patient 01/29/15 0047     Chief Complaint  Patient presents with  . SEXUALLY TRANSMITTED DISEASE     (Consider location/radiation/quality/duration/timing/severity/associated sxs/prior Treatment) The history is provided by the patient and medical records. No language interpreter was used.     Cameron Wise is a 34 y.o. male  with no major medical Hx presents to the Emergency Department complaining of gradual, persistent, progressively worsening "white bumps" around his penis onset 3 days ago. Associated symptoms include watery penile discharge.  He reports unprotected sexual intercourse 1.5 weeks ago.  Patient reports numerous episodes of unprotected sex over the last several months with male prostitutes. He denies anal intercourse or intercourse with another male. He also reports shaving his pubic area 4 days ago.  He states that after this a rash arose on his mons pubis. He reports that it itches but has not spread. He denies noting lice.  The treatments prior to arrival. Nothing makes the symptoms better or worse. He denies fever, chills, headache, neck pain, chest pain, shortness of breath, abdominal pain, nausea, vomiting, diarrhea, weakness, dizziness, syncope. Patient endorses associated dysuria.  History reviewed. No pertinent past medical history. History reviewed. No pertinent past surgical history. History reviewed. No pertinent family history. History  Substance Use Topics  . Smoking status: Never Smoker   . Smokeless tobacco: Not on file  . Alcohol Use: No    Review of Systems  Constitutional: Negative for fever, diaphoresis, appetite change, fatigue and unexpected weight change.  HENT: Negative for mouth sores.   Eyes: Negative for visual disturbance.  Respiratory: Negative for cough, chest tightness, shortness of breath and wheezing.   Cardiovascular: Negative for chest  pain.  Gastrointestinal: Negative for nausea, vomiting, abdominal pain, diarrhea and constipation.  Endocrine: Negative for polydipsia, polyphagia and polyuria.  Genitourinary: Positive for dysuria and discharge. Negative for urgency, frequency and hematuria.  Musculoskeletal: Negative for back pain and neck stiffness.  Skin: Positive for rash.  Allergic/Immunologic: Negative for immunocompromised state.  Neurological: Negative for syncope, light-headedness and headaches.  Hematological: Does not bruise/bleed easily.  Psychiatric/Behavioral: Negative for sleep disturbance. The patient is not nervous/anxious.       Allergies  Review of patient's allergies indicates no known allergies.  Home Medications   Prior to Admission medications   Medication Sig Start Date End Date Taking? Authorizing Provider  doxycycline (VIBRAMYCIN) 100 MG capsule Take 1 capsule (100 mg total) by mouth 2 (two) times daily. 07/16/13   Sunnie Nielsen, MD   BP 160/100 mmHg  Pulse 83  Temp(Src) 97.4 F (36.3 C) (Oral)  Resp 16  SpO2 96% Physical Exam  Constitutional: He appears well-developed and well-nourished. No distress.  Awake, alert, nontoxic appearance  HENT:  Head: Normocephalic and atraumatic.  Mouth/Throat: Oropharynx is clear and moist. No oropharyngeal exudate.  Eyes: Conjunctivae are normal. No scleral icterus.  Neck: Normal range of motion. Neck supple.  Cardiovascular: Normal rate, regular rhythm and intact distal pulses.   Pulmonary/Chest: Effort normal and breath sounds normal. No respiratory distress. He has no wheezes.  Equal chest expansion  Abdominal: Soft. Bowel sounds are normal. He exhibits no mass. There is no tenderness. There is no rebound and no guarding. Hernia confirmed negative in the right inguinal area and confirmed negative in the left inguinal area.  Genitourinary: Testes normal. Cremasteric reflex is present. Right testis shows no mass, no swelling and  no tenderness. Right  testis is descended. Cremasteric reflex is not absent on the right side. Uncircumcised. No phimosis, paraphimosis, hypospadias, penile erythema or penile tenderness. Discharge ( clear) found.  Papular rash noted to the mons pubis consistent with mild folliculitis; no induration or erythema indicative of secondary infection  Musculoskeletal: Normal range of motion. He exhibits no edema.  Lymphadenopathy:       Right: No inguinal adenopathy present.       Left: No inguinal adenopathy present.  Neurological: He is alert.  Speech is clear and goal oriented Moves extremities without ataxia  Skin: Skin is warm and dry. He is not diaphoretic.  Psychiatric: He has a normal mood and affect.  Nursing note and vitals reviewed.   ED Course  Procedures (including critical care time) Labs Review Labs Reviewed  URINALYSIS, ROUTINE W REFLEX MICROSCOPIC (NOT AT [facility]) - Abnormal; Notable for the following:    Hgb urine dipstick MODERATE (*)    All other components within normal limits  URINE MICROSCOPIC-ADD ON - Abnormal; Notable for the following:    Squamous Epithelial / LPF MANY (*)    All other components within normal limits  RPR  HIV ANTIBODY (ROUTINE TESTING)  GC/CHLAMYDIA PROBE AMP ([place]) NOT AT [facility]    Imaging Review No results found.   EKG Interpretation None      MDM   Final diagnoses:  Concern about STD in male without diagnosis   Kardell Frimpong presents with complaints of penile discharge after numerous encounters with prostitutes and unprotected sexual intercourse. Patient is afebrile without abdominal tenderness, abdominal pain or painful bowel movements to indicate prostatitis.  No tenderness to palpation of the testes or epididymis to suggest orchitis or epididymitis.  STD cultures obtained including HIV, syphilis, gonorrhea and chlamydia. Patient to be discharged with instructions to follow up with PCP. Discussed importance of using protection when sexually active. Pt  understands that they have GC/Chlamydia cultures pending and that they will need to inform all sexual partners if results return positive. Patient has been treated prophylactically with azithromycin, Flagyl (pt endorses very frothy urine) and Rocephin.      Patient noted to be hypertensive in the emergency department.  No signs of hypertensive urgency.  Discussed with patient the need for close follow-up and management by their primary care physician.   BP 160/100 mmHg (manual)  Pulse 82  Temp(Src) 99 F (37.2 C) (Oral)  Resp 16  SpO2 98%      Dierdre Forth, PA-C 01/29/15 0246  Marisa Severin, MD 01/29/15 [phone]

## 2015-03-31 ENCOUNTER — Encounter (HOSPITAL_COMMUNITY): Payer: Self-pay | Admitting: Emergency Medicine

## 2015-03-31 ENCOUNTER — Emergency Department (HOSPITAL_COMMUNITY): Payer: Self-pay

## 2015-03-31 ENCOUNTER — Emergency Department (HOSPITAL_COMMUNITY)
Admission: EM | Admit: 2015-03-31 | Discharge: 2015-03-31 | Disposition: A | Discharge status: Home / Self Care | Payer: Self-pay | Attending: Emergency Medicine | Admitting: Emergency Medicine

## 2015-03-31 DIAGNOSIS — H1132 Conjunctival hemorrhage, left eye: Secondary | ICD-10-CM | POA: Insufficient documentation

## 2015-03-31 DIAGNOSIS — Y999 Unspecified external cause status: Secondary | ICD-10-CM | POA: Insufficient documentation

## 2015-03-31 DIAGNOSIS — Y929 Unspecified place or not applicable: Secondary | ICD-10-CM | POA: Insufficient documentation

## 2015-03-31 DIAGNOSIS — Z792 Long term (current) use of antibiotics: Secondary | ICD-10-CM | POA: Insufficient documentation

## 2015-03-31 DIAGNOSIS — S023XXA Fracture of orbital floor, initial encounter for closed fracture: Secondary | ICD-10-CM | POA: Insufficient documentation

## 2015-03-31 DIAGNOSIS — Y939 Activity, unspecified: Secondary | ICD-10-CM | POA: Insufficient documentation

## 2015-03-31 DIAGNOSIS — S0230XA Fracture of orbital floor, unspecified side, initial encounter for closed fracture: Secondary | ICD-10-CM

## 2015-03-31 MED ORDER — IBUPROFEN 600 MG PO TABS: 600.0000 mg | ORAL_TABLET | Freq: Four times a day (QID) | ORAL | Status: DC | PRN

## 2015-03-31 MED ORDER — AMOXICILLIN-POT CLAVULANATE 875-125 MG PO TABS
1.0000 | ORAL_TABLET | Freq: Once | ORAL | Status: AC
  Administered 2015-03-31: 1 via ORAL
  Filled 2015-03-31: qty 1

## 2015-03-31 MED ORDER — AMOXICILLIN-POT CLAVULANATE 875-125 MG PO TABS: 1.0000 | ORAL_TABLET | Freq: Two times a day (BID) | ORAL | Status: DC
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
## 2015-05-19 ENCOUNTER — Emergency Department (HOSPITAL_COMMUNITY)
Admission: EM | Admit: 2015-05-19 | Discharge: 2015-05-20 | Disposition: A | Discharge status: Home / Self Care | Payer: Self-pay | Attending: Emergency Medicine | Admitting: Emergency Medicine

## 2015-05-19 ENCOUNTER — Emergency Department (HOSPITAL_COMMUNITY): Payer: Self-pay

## 2015-05-19 ENCOUNTER — Encounter (HOSPITAL_COMMUNITY): Payer: Self-pay | Admitting: Emergency Medicine

## 2015-05-19 DIAGNOSIS — R079 Chest pain, unspecified: Secondary | ICD-10-CM | POA: Insufficient documentation

## 2015-05-19 DIAGNOSIS — I10 Essential (primary) hypertension: Secondary | ICD-10-CM | POA: Insufficient documentation

## 2015-05-19 DIAGNOSIS — Z202 Contact with and (suspected) exposure to infections with a predominantly sexual mode of transmission: Secondary | ICD-10-CM

## 2015-05-19 LAB — BASIC METABOLIC PANEL
ANION GAP: 9 (ref 5–15)
BUN: 12 mg/dL (ref 6–20)
CHLORIDE: 98 mmol/L — AB (ref 101–111)
CO2: 27 mmol/L (ref 22–32)
CREATININE: 0.9 mg/dL (ref 0.61–1.24)
Calcium: 9 mg/dL (ref 8.9–10.3)
GFR calc non Af Amer: >60 mL/min (ref >60)
Glucose, Bld: 106 mg/dL — ABNORMAL HIGH (ref 65–99)
Potassium: 3.9 mmol/L (ref 3.5–5.1)
SODIUM: 134 mmol/L — AB (ref 135–145)

## 2015-05-19 LAB — TROPONIN I: Troponin I: <0.03 ng/mL (ref <0.031)

## 2015-05-19 LAB — CBC
HCT: 46.3 % (ref 39.0–52.0)
HEMOGLOBIN: 16.3 g/dL (ref 13.0–17.0)
MCH: 31.7 pg (ref 26.0–34.0)
MCHC: 35.2 g/dL (ref 30.0–36.0)
MCV: 90.1 fL (ref 78.0–100.0)
PLATELETS: 266 10*3/uL (ref 150–400)
RBC: 5.14 MIL/uL (ref 4.22–5.81)
RDW: 12.7 % (ref 11.5–15.5)
WBC: 7.8 10*3/uL (ref 4.0–10.5)

## 2015-05-19 NOTE — ED Notes (Signed)
Pt reports mid chest burning sensation x 3 weeks. Reports he recently stopped drinking alcohol (4-5 days ago). Pt also reports penile discharge and is concerned he has an STD.

## 2015-05-19 NOTE — ED Provider Notes (Signed)
CSN: [id number]     Arrival date & time 05/19/15  1918 History   By signing my name below, I, Cameron Wise, attest that this documentation has been prepared under the direction and in the presence of Derwood Kaplan, MD. Electronically Signed: Arlan Wise, ED Scribe. 05/19/2015. 12:10 AM.   Chief Complaint  Patient presents with  . SEXUALLY TRANSMITTED DISEASE  . Chest Pain   The history is provided by the patient. No language interpreter was used.    HPI Comments: Cameron Wise is a 34 y.o. male with a PMHx of HTN who presents to the Emergency Department here for possible STD exposure this evening. Pt reports constant, ongoing dysuria, foul smelling urine, and watery clear penile discharge x 1 month. He admits to having unprotected sex before and after onset of current symptoms. Mr. [name] also reports intermittent, ongoing substernal chest pain that moves to his throat x 1 month. Pain is described as burning and comes on with and without eating. Discomfort is noticeably worsened after eating greasy foods without any alleviating factors. Pt states he stopped drinking approximately 4-5 days ago. No OTC medications or home remedies attempted prior to arrival. No recent fever, chills, nausea, vomiting, abdominal pain, chest pain, or shortness of breath. No known allergies to medications.  History reviewed. No pertinent past medical history. History reviewed. No pertinent past surgical history. No family history on file. Social History  Substance Use Topics  . Smoking status: Never Smoker   . Smokeless tobacco: None  . Alcohol Use: No    Review of Systems  Constitutional: Negative for fever and chills.  Respiratory: Negative for cough and shortness of breath.   Cardiovascular: Positive for chest pain.  Gastrointestinal: Negative for nausea, vomiting, abdominal pain and diarrhea.  Genitourinary: Positive for dysuria and discharge.  Musculoskeletal: Negative for back pain.  Skin:  Negative for rash.  Neurological: Negative for headaches.  Psychiatric/Behavioral: Negative for confusion.  All other systems reviewed and are negative.     Allergies  Review of patient's allergies indicates no known allergies.  Home Medications   Prior to Admission medications   Not on File   Triage Vitals: BP 166/115 mmHg  Pulse 80  Temp(Src) 98 F (36.7 C) (Oral)  Resp 16  Ht  (1.702 m)  Wt 189 lb 9 oz (85.985 kg)  BMI 29.68 kg/m2  SpO2 97%   Physical Exam  Constitutional: He is oriented to person, place, and time. He appears well-developed and well-nourished.  HENT:  Head: Normocephalic and atraumatic.  Eyes: EOM are normal.  Neck: Normal range of motion.  Cardiovascular: Normal rate, regular rhythm, normal heart sounds and intact distal pulses.   Pulmonary/Chest: Effort normal and breath sounds normal. No respiratory distress.  Abdominal: Soft. He exhibits no distension. There is no tenderness.  Genitourinary:  Pt has erythematous macules in suprapubic/paraneal region  No evidence of rash to the scrotum or penis  No paraphimosis No drainage from urethra No inguinal lymphadenopathy   Musculoskeletal: Normal range of motion.  Lymphadenopathy:    He has no cervical adenopathy.  Neurological: He is alert and oriented to person, place, and time.  Skin: Skin is warm and dry.  Psychiatric: He has a normal mood and affect. Judgment normal.  Nursing note and vitals reviewed.   ED Course  Procedures (including critical care time)  DIAGNOSTIC STUDIES: Oxygen Saturation is 95% on RA, adequate by my interpretation.    COORDINATION OF CARE: 12:10 AM- Will order CXR, rapid HIV  antibody, urinalysis, BMP, CBC, troponin I, and EKG. Discussed treatment plan with pt at bedside and pt agreed to plan.    Labs Review Labs Reviewed  BASIC METABOLIC PANEL - Abnormal; Notable for the following:    Sodium 134 (*)    Chloride 98 (*)    Glucose, Bld 106 (*)    All other  components within normal limits  URINALYSIS, ROUTINE W REFLEX MICROSCOPIC (NOT AT [facility]) - Abnormal; Notable for the following:    APPearance HAZY (*)    All other components within normal limits  URINE CULTURE  CBC  TROPONIN I  GC/CHLAMYDIA PROBE AMP ([place]) NOT AT [facility]    Imaging Review No results found. I have personally reviewed and evaluated these images and lab results as part of my medical decision-making.   EKG Interpretation   Date/Time:  Tuesday May 19 2015 19:58:07 EDT Ventricular Rate:  89 PR Interval:  130 QRS Duration: 98 QT Interval:  358 QTC Calculation: 435 R Axis:   37 Text Interpretation:  Normal sinus rhythm Normal ECG No old tracing to  compare Confirmed by Rhunette Croft, MD, Janey Genta [phone]) on 05/19/2015 11:36:52 PM      MDM   Final diagnoses:  Possible exposure to STD    I personally performed the services described in this documentation, which was scribed in my presence. The recorded information has been reviewed and is accurate.   + STD exposure potentially. Some clear d.c. Prophylactic meds given for GC and Chlamydia and f/u with health dept recommended for full workup. Safe sex recommendations given. No chest pain during my eval. ekg and trops are neg for acute process.  Derwood Kaplan, MD 05/29/15 [phone]

## 2015-05-20 LAB — GC/CHLAMYDIA PROBE AMP (~~LOC~~) NOT AT ARMC
CHLAMYDIA, DNA PROBE: NEGATIVE
NEISSERIA GONORRHEA: NEGATIVE

## 2015-05-20 LAB — URINALYSIS, ROUTINE W REFLEX MICROSCOPIC
BILIRUBIN URINE: NEGATIVE
Glucose, UA: NEGATIVE mg/dL
HGB URINE DIPSTICK: NEGATIVE
KETONES UR: NEGATIVE mg/dL
LEUKOCYTES UA: NEGATIVE
NITRITE: NEGATIVE
PROTEIN: NEGATIVE mg/dL
Specific Gravity, Urine: 1.021 (ref 1.005–1.030)
Urobilinogen, UA: 0.2 mg/dL (ref 0.0–1.0)
pH: 5.5 (ref 5.0–8.0)

## 2015-05-20 MED ORDER — METRONIDAZOLE 500 MG PO TABS
2000.0000 mg | ORAL_TABLET | Freq: Once | ORAL | Status: AC
  Administered 2015-05-20: 2000 mg via ORAL
  Filled 2015-05-20: qty 4

## 2015-05-20 MED ORDER — AZITHROMYCIN 250 MG PO TABS
1000.0000 mg | ORAL_TABLET | Freq: Once | ORAL | Status: AC
  Administered 2015-05-20: 1000 mg via ORAL
  Filled 2015-05-20: qty 4

## 2015-05-20 MED ORDER — LIDOCAINE HCL (PF) 1 % IJ SOLN
2.0000 mL | Freq: Once | INTRAMUSCULAR | Status: AC
  Administered 2015-05-20: 2 mL
  Filled 2015-05-20: qty 5

## 2015-05-20 MED ORDER — CEFTRIAXONE SODIUM 250 MG IJ SOLR
250.0000 mg | Freq: Once | INTRAMUSCULAR | Status: AC
  Administered 2015-05-20: 250 mg via INTRAMUSCULAR
  Filled 2015-05-20: qty 250

## 2015-05-20 NOTE — ED Notes (Signed)
See downtime form

## 2015-05-20 NOTE — ED Notes (Signed)
Pt c/o chest pain x 3 weeks, which is no longer present on arrival. Pt also requesting STD testing for exposure from unprotected sex, c/o dysuria, denies abd pain

## 2015-05-21 LAB — URINE CULTURE

## 2015-09-22 ENCOUNTER — Encounter (HOSPITAL_COMMUNITY): Payer: Self-pay | Admitting: Emergency Medicine

## 2015-09-22 DIAGNOSIS — I10 Essential (primary) hypertension: Secondary | ICD-10-CM | POA: Insufficient documentation

## 2015-09-22 DIAGNOSIS — A64 Unspecified sexually transmitted disease: Secondary | ICD-10-CM | POA: Insufficient documentation

## 2015-09-22 LAB — CBC WITH DIFFERENTIAL/PLATELET
BASOS PCT: 1 %
Basophils Absolute: 0.1 10*3/uL (ref 0.0–0.1)
EOS PCT: 3 %
Eosinophils Absolute: 0.2 10*3/uL (ref 0.0–0.7)
HEMATOCRIT: 45.2 % (ref 39.0–52.0)
HEMOGLOBIN: 15.7 g/dL (ref 13.0–17.0)
Lymphocytes Relative: 29 %
Lymphs Abs: 2.8 10*3/uL (ref 0.7–4.0)
MCH: 31.2 pg (ref 26.0–34.0)
MCHC: 34.7 g/dL (ref 30.0–36.0)
MCV: 89.7 fL (ref 78.0–100.0)
MONO ABS: 0.6 10*3/uL (ref 0.1–1.0)
Monocytes Relative: 6 %
NEUTROS ABS: 5.9 10*3/uL (ref 1.7–7.7)
Neutrophils Relative %: 61 %
Platelets: 237 10*3/uL (ref 150–400)
RBC: 5.04 MIL/uL (ref 4.22–5.81)
RDW: 13 % (ref 11.5–15.5)
WBC: 9.5 10*3/uL (ref 4.0–10.5)
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
## 2015-09-23 ENCOUNTER — Emergency Department (HOSPITAL_COMMUNITY)
Admission: EM | Admit: 2015-09-23 | Discharge: 2015-09-23 | Disposition: A | Discharge status: Home / Self Care | Payer: Self-pay | Attending: Emergency Medicine | Admitting: Emergency Medicine

## 2015-09-23 DIAGNOSIS — A64 Unspecified sexually transmitted disease: Secondary | ICD-10-CM

## 2015-09-23 DIAGNOSIS — I10 Essential (primary) hypertension: Secondary | ICD-10-CM

## 2015-09-23 HISTORY — DX: Essential (primary) hypertension: I10

## 2015-09-23 LAB — BASIC METABOLIC PANEL
ANION GAP: 12 (ref 5–15)
BUN: 14 mg/dL (ref 6–20)
CO2: 22 mmol/L (ref 22–32)
Calcium: 9.5 mg/dL (ref 8.9–10.3)
Chloride: 104 mmol/L (ref 101–111)
Creatinine, Ser: 0.9 mg/dL (ref 0.61–1.24)
GFR calc Af Amer: >60 mL/min (ref >60)
GFR calc non Af Amer: >60 mL/min (ref >60)
GLUCOSE: 114 mg/dL — AB (ref 65–99)
POTASSIUM: 4.1 mmol/L (ref 3.5–5.1)
Sodium: 138 mmol/L (ref 135–145)

## 2015-09-23 LAB — URINALYSIS, ROUTINE W REFLEX MICROSCOPIC
Bilirubin Urine: NEGATIVE
GLUCOSE, UA: NEGATIVE mg/dL
HGB URINE DIPSTICK: NEGATIVE
Ketones, ur: NEGATIVE mg/dL
LEUKOCYTES UA: NEGATIVE
Nitrite: NEGATIVE
PH: 6.5 (ref 5.0–8.0)
Protein, ur: NEGATIVE mg/dL
Specific Gravity, Urine: 1.01 (ref 1.005–1.030)

## 2015-09-23 LAB — GC/CHLAMYDIA PROBE AMP (~~LOC~~) NOT AT ARMC
Chlamydia: NEGATIVE
Neisseria Gonorrhea: NEGATIVE

## 2015-09-23 LAB — RPR: RPR: NONREACTIVE

## 2015-09-23 LAB — HIV ANTIBODY (ROUTINE TESTING W REFLEX): HIV SCREEN 4TH GENERATION: NONREACTIVE

## 2015-09-23 MED ORDER — METRONIDAZOLE 500 MG PO TABS
2000.0000 mg | ORAL_TABLET | Freq: Once | ORAL | Status: AC
  Administered 2015-09-23: 2000 mg via ORAL
  Filled 2015-09-23: qty 4

## 2015-09-23 MED ORDER — HYDROCHLOROTHIAZIDE 25 MG PO TABS: 25.0000 mg | ORAL_TABLET | Freq: Every day | ORAL | Status: DC

## 2015-09-23 MED ORDER — CEFTRIAXONE SODIUM 250 MG IJ SOLR
250.0000 mg | Freq: Once | INTRAMUSCULAR | Status: AC
  Administered 2015-09-23: 250 mg via INTRAMUSCULAR
  Filled 2015-09-23: qty 250

## 2015-09-23 MED ORDER — AZITHROMYCIN 250 MG PO TABS
1000.0000 mg | ORAL_TABLET | Freq: Once | ORAL | Status: AC
  Administered 2015-09-23: 1000 mg via ORAL
  Filled 2015-09-23: qty 4
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
## 2015-09-23 NOTE — Discharge Instructions (Signed)
Hypertension Hypertension, commonly called high blood pressure, is when the force of blood pumping through your arteries is too strong. Your arteries are the blood vessels that carry blood from your heart throughout your body. A blood pressure reading consists of a higher number over a lower number, such as 110/72. The higher number (systolic) is the pressure inside your arteries when your heart pumps. The lower number (diastolic) is the pressure inside your arteries when your heart relaxes. Ideally you want your blood pressure below 120/80. Hypertension forces your heart to work harder to pump blood. Your arteries may become narrow or stiff. Having untreated or uncontrolled hypertension can cause heart attack, stroke, kidney disease, and other problems. RISK FACTORS Some risk factors for high blood pressure are controllable. Others are not.  Risk factors you cannot control include:   Race. You may be at higher risk if you are African American.  Age. Risk increases with age.  Gender. Men are at higher risk than women before age 45 years. After age 65, women are at higher risk than men. Risk factors you can control include:  Not getting enough exercise or physical activity.  Being overweight.  Getting too much fat, sugar, calories, or salt in your diet.  Drinking too much alcohol. SIGNS AND SYMPTOMS Hypertension does not usually cause signs or symptoms. Extremely high blood pressure (hypertensive crisis) may cause headache, anxiety, shortness of breath, and nosebleed. DIAGNOSIS To check if you have hypertension, your health care provider will measure your blood pressure while you are seated, with your arm held at the level of your heart. It should be measured at least twice using the same arm. Certain conditions can cause a difference in blood pressure between your right and left arms. A blood pressure reading that is higher than normal on one occasion does not mean that you need treatment. If  it is not clear whether you have high blood pressure, you may be asked to return on a different day to have your blood pressure checked again. Or, you may be asked to monitor your blood pressure at home for 1 or more weeks. TREATMENT Treating high blood pressure includes making lifestyle changes and possibly taking medicine. Living a healthy lifestyle can help lower high blood pressure. You may need to change some of your habits. Lifestyle changes may include:  Following the DASH diet. This diet is high in fruits, vegetables, and whole grains. It is low in salt, red meat, and added sugars.  Keep your sodium intake below 2,300 mg per day.  Getting at least 30-45 minutes of aerobic exercise at least 4 times per week.  Losing weight if necessary.  Not smoking.  Limiting alcoholic beverages.  Learning ways to reduce stress. Your health care provider may prescribe medicine if lifestyle changes are not enough to get your blood pressure under control, and if one of the following is true:  You are 18-59 years of age and your systolic blood pressure is above 140.  You are 60 years of age or older, and your systolic blood pressure is above 150.  Your diastolic blood pressure is above 90.  You have diabetes, and your systolic blood pressure is over 140 or your diastolic blood pressure is over 90.  You have kidney disease and your blood pressure is above 140/90.  You have heart disease and your blood pressure is above 140/90. Your personal target blood pressure may vary depending on your medical conditions, your age, and other factors. HOME CARE INSTRUCTIONS    Have your blood pressure rechecked as directed by your health care provider.   Take medicines only as directed by your health care provider. Follow the directions carefully. Blood pressure medicines must be taken as prescribed. The medicine does not work as well when you skip doses. Skipping doses also puts you at risk for  problems.  Do not smoke.   Monitor your blood pressure at home as directed by your health care provider. SEEK MEDICAL CARE IF:   You think you are having a reaction to medicines taken.  You have recurrent headaches or feel dizzy.  You have swelling in your ankles.  You have trouble with your vision. SEEK IMMEDIATE MEDICAL CARE IF:  You develop a severe headache or confusion.  You have unusual weakness, numbness, or feel faint.  You have severe chest or abdominal pain.  You vomit repeatedly.  You have trouble breathing. MAKE SURE YOU:   Understand these instructions.  Will watch your condition.  Will get help right away if you are not doing well or get worse.   This information is not intended to replace advice given to you by your health care provider. Make sure you discuss any questions you have with your health care provider.   Document Released: 07/25/2005 Document Revised: 12/09/2014 Document Reviewed: 05/17/2013 Elsevier Interactive Patient Education 2016 [employer].  Sexually Transmitted Disease A sexually transmitted disease (STD) is a disease or infection that may be passed (transmitted) from person to person, usually during sexual activity. This may happen by way of saliva, semen, blood, vaginal mucus, or urine. Common STDs include:  Gonorrhea.  Chlamydia.  Syphilis.  HIV and AIDS.  Genital herpes.  Hepatitis B and C.  Trichomonas.  Human papillomavirus (HPV).  Pubic lice.  Scabies.  Mites.  Bacterial vaginosis. WHAT ARE CAUSES OF STDs? An STD may be caused by bacteria, a virus, or parasites. STDs are often transmitted during sexual activity if one person is infected. However, they may also be transmitted through nonsexual means. STDs may be transmitted after:   Sexual intercourse with an infected person.  Sharing sex toys with an infected person.  Sharing needles with an infected person or using unclean piercing or tattoo  needles.  Having intimate contact with the genitals, mouth, or rectal areas of an infected person.  Exposure to infected fluids during birth. WHAT ARE THE SIGNS AND SYMPTOMS OF STDs? Different STDs have different symptoms. Some people may not have any symptoms. If symptoms are present, they may include:  Painful or bloody urination.  Pain in the pelvis, abdomen, vagina, anus, throat, or eyes.  A skin rash, itching, or irritation.  Growths, ulcerations, blisters, or sores in the genital and anal areas.  Abnormal vaginal discharge with or without bad odor.  Penile discharge in men.  Fever.  Pain or bleeding during sexual intercourse.  Swollen glands in the groin area.  Yellow skin and eyes (jaundice). This is seen with hepatitis.  Swollen testicles.  Infertility.  Sores and blisters in the mouth. HOW ARE STDs DIAGNOSED? To make a diagnosis, your health care provider may:  Take a medical history.  Perform a physical exam.  Take a sample of any discharge to examine.  Swab the throat, cervix, opening to the penis, rectum, or vagina for testing.  Test a sample of your first morning urine.  Perform blood tests.  Perform a Pap test, if this applies.  Perform a colposcopy.  Perform a laparoscopy. HOW ARE STDs TREATED? Treatment depends on the  STD. Some STDs may be treated but not cured.  Chlamydia, gonorrhea, trichomonas, and syphilis can be cured with antibiotic medicine.  Genital herpes, hepatitis, and HIV can be treated, but not cured, with prescribed medicines. The medicines lessen symptoms.  Genital warts from HPV can be treated with medicine or by freezing, burning (electrocautery), or surgery. Warts may come back.  HPV cannot be cured with medicine or surgery. However, abnormal areas may be removed from the cervix, vagina, or vulva.  If your diagnosis is confirmed, your recent sexual partners need treatment. This is true even if they are symptom-free or  have a negative culture or evaluation. They should not have sex until their health care providers say it is okay.  Your health care provider may test you for infection again 3 months after treatment. HOW CAN I REDUCE MY RISK OF GETTING AN STD? Take these steps to reduce your risk of getting an STD:  Use latex condoms, dental dams, and water-soluble lubricants during sexual activity. Do not use petroleum jelly or oils.  Avoid having multiple sex partners.  Do not have sex with someone who has other sex partners  Do not have sex with anyone you do not know or who is at high risk for an STD.  Avoid risky sex practices that can break your skin.  Do not have sex if you have open sores on your mouth or skin.  Avoid drinking too much alcohol or taking illegal drugs. Alcohol and drugs can affect your judgment and put you in a vulnerable position.  Avoid engaging in oral and anal sex acts.  Get vaccinated for HPV and hepatitis. If you have not received these vaccines in the past, talk to your health care provider about whether one or both might be right for you.  If you are at risk of being infected with HIV, it is recommended that you take a prescription medicine daily to prevent HIV infection. This is called pre-exposure prophylaxis (PrEP). You are considered at risk if:  You are a man who has sex with other men (MSM).  You are a heterosexual man or woman and are sexually active with more than one partner.  You take drugs by injection.  You are sexually active with a partner who has HIV.  Talk with your health care provider about whether you are at high risk of being infected with HIV. If you choose to begin PrEP, you should first be tested for HIV. You should then be tested every 3 months for as long as you are taking PrEP. WHAT SHOULD I DO IF I THINK I HAVE AN STD?  See your health care provider.  Tell your sexual partner(s). They should be tested and treated for any STDs.  Do not  have sex until your health care provider says it is okay. WHEN SHOULD I GET IMMEDIATE MEDICAL CARE? Contact your health care provider right away if:   You have severe abdominal pain.  You are a man and notice swelling or pain in your testicles.  You are a woman and notice swelling or pain in your vagina.   This information is not intended to replace advice given to you by your health care provider. Make sure you discuss any questions you have with your health care provider.   Document Released: 10/15/2002 Document Revised: 08/15/2014 Document Reviewed: 02/12/2013 Elsevier Interactive Patient Education 2016 [employer]. Substance Abuse Treatment Programs  Intensive Outpatient Programs [facility]     [phone]  Jeanella Anton      [place], [place]                   [phone]       [facility] [address] [place], [place] [phone]  [facility]     (Inpatient and outpatient)     [address]           [phone]    [facility] [phone] (Suboxone and Methadone)  [address]      [place], [place] [address]      [phone]       [address] [place], [place] [phone]  [facility] (Outpatient/Inpatient, [facility])    (insurance only) [phone]             [facility] (Groups & Residential) [place], [place] [phone]     [facility]     [address]     [place], [place]      [phone]       [facility] (for caregivers and family) [phone] [address] [place], [place] [phone]      Residential Treatment Programs [facility]      [address] [place], [place] [address]  [phone]       [facility] [address], [place], [place] [address] [phone]  [facility] [place]        [phone]       [facility] [phone]  [facility] ([facility]             [address]                                          [place], [place]                                                [phone] or [phone]                               [facility] [place] [address] [place], [address] [phone]  [facility]    [address]      [place], [place]     [phone]       [facility] [address] [place], [place] [phone]  [facility]   [address] [place], [place] [address]     [phone]      Admissions: 8am-3pm M-F  [facility] ([facility]) [address] [place], [place] [phone]  [facility]: Residential Program [phone] Days)   [place], [place]      [phone] or [phone]     [facility]: [facility] [place], [place] (Walk in Hours over the weekend or by referral)  [facility] [address] [place], [place], [place] [address] [phone]  Crisis Mobile: [facility]:  [phone] (for crisis response 24 hours a day) [facility] Hotline:      [phone] Outpatient Psychiatry and Counseling  [facility]: Mobile Crisis Management 24 hours:  [phone]  [facility] [place] sliding scale fee and walk in schedule:  M-F 8am-12pm/1pm-3pm [address]  [place], [place] [address] [phone]  [facility] [address] [place], [place] [address] [phone]  [facility] ([facility])- new patient walk-in appointments available Monday - Friday 8am -3pm.          [address] [place], [place] [address] [phone] or crisis line- [phone]  [facility]/ Intensive Outpatient Therapy Program [address] [place], [place] [address] [phone]  [facility]                  Crisis Services      [phone]     [place], [place] [address]                 [facility]   [facility] [phone]. [address] [place], [place] [address]   [facility] of Care          [address] Bea Laura  [place], [place] [address]       [phone]  [facility] [address] [place], [place] [address] [phone]  [facility]    [address]    [place], [place] [address]     [phone]       Andee Poles, MD     [address]     [place] [address]     [phone]       [facility] [address] [place] [address]  [facility]     [address] [place], [place]      [phone]       [facility] Eulogio Ditch, MD [address] [place], [place] [address] [phone]  [facility]     [address]     [place], [place] [address]     [phone]       [facility] [address] [place], [place] [address] [phone] Resources for Temporary Residential Assistance/Crisis Centers  [facility]) M-F 8am-3pm   [address] [place], [place] [address]   [phone] Services include: laundry, barbering, support groups, case management, phone  & computer access, showers, AA/NA mtgs, mental health/substance abuse nurse, job skills class, disability information, VA assistance, spiritual classes, etc.   HOMELESS SHELTERS  [facility]     [facility]   [address], [place]     [phone]              [employer] (women and children)       [address] [place], [place] [address] [phone] [email or web address] for application and process Application Required  [facility]   [address]    [place] [address]     [phone]                    [facility] [place] [address] [place], [place] [address] [phone] application appt.) Application Required  [facility] (women only)    [address]     [place], [place] [address]     [phone]      Intake starts 6pm daily Need valid ID, SSC, & Police report [employer] [address]  [address] [place], [place] [phone] Application Required  [employer] (men only)     [address]      [place], [place]     [phone]       [facility] [place] (Pregnant women only) [address] [place], [place] [phone]  [facility]      [address]      [place], [place] [address]     [phone]             [facility] [address] [place], [place] [phone] 90 day commitment/SA/Application process  [facility](men only)     [address]     [place], [place]     [phone]       Check-in at [facility] of [facility] [address] [place], [place] [address] [phone] Men/Women/Women and Children must be there by 7 pm  [facility] [place], [place] [phone]

## 2016-03-23 ENCOUNTER — Encounter (HOSPITAL_COMMUNITY): Payer: Self-pay | Admitting: *Deleted

## 2016-03-23 ENCOUNTER — Emergency Department (HOSPITAL_COMMUNITY)
Admission: EM | Admit: 2016-03-23 | Discharge: 2016-03-24 | Disposition: A | Discharge status: Home / Self Care | Payer: Self-pay | Attending: Emergency Medicine | Admitting: Emergency Medicine

## 2016-03-23 DIAGNOSIS — Z202 Contact with and (suspected) exposure to infections with a predominantly sexual mode of transmission: Secondary | ICD-10-CM | POA: Insufficient documentation

## 2016-03-23 DIAGNOSIS — R42 Dizziness and giddiness: Secondary | ICD-10-CM | POA: Insufficient documentation

## 2016-03-23 DIAGNOSIS — I10 Essential (primary) hypertension: Secondary | ICD-10-CM | POA: Insufficient documentation

## 2016-03-23 DIAGNOSIS — Z79899 Other long term (current) drug therapy: Secondary | ICD-10-CM | POA: Insufficient documentation

## 2016-03-23 DIAGNOSIS — Z711 Person with feared health complaint in whom no diagnosis is made: Secondary | ICD-10-CM

## 2016-03-23 LAB — URINALYSIS, ROUTINE W REFLEX MICROSCOPIC
Glucose, UA: NEGATIVE mg/dL
Hgb urine dipstick: NEGATIVE
KETONES UR: NEGATIVE mg/dL
LEUKOCYTES UA: NEGATIVE
NITRITE: NEGATIVE
PH: 5.5 (ref 5.0–8.0)
PROTEIN: NEGATIVE mg/dL
Specific Gravity, Urine: 1.031 — ABNORMAL HIGH (ref 1.005–1.030)

## 2016-03-23 LAB — BASIC METABOLIC PANEL
Anion gap: 6 (ref 5–15)
BUN: 10 mg/dL (ref 6–20)
CHLORIDE: 108 mmol/L (ref 101–111)
CO2: 25 mmol/L (ref 22–32)
CREATININE: 0.98 mg/dL (ref 0.61–1.24)
Calcium: 9.2 mg/dL (ref 8.9–10.3)
Glucose, Bld: 96 mg/dL (ref 65–99)
Potassium: 3.9 mmol/L (ref 3.5–5.1)
SODIUM: 139 mmol/L (ref 135–145)

## 2016-03-23 LAB — CBC
HCT: 46.3 % (ref 39.0–52.0)
Hemoglobin: 15.9 g/dL (ref 13.0–17.0)
MCH: 31.9 pg (ref 26.0–34.0)
MCHC: 34.3 g/dL (ref 30.0–36.0)
MCV: 93 fL (ref 78.0–100.0)
PLATELETS: 249 10*3/uL (ref 150–400)
RBC: 4.98 MIL/uL (ref 4.22–5.81)
RDW: 13.1 % (ref 11.5–15.5)
WBC: 7.9 10*3/uL (ref 4.0–10.5)

## 2016-03-23 NOTE — ED Triage Notes (Addendum)
Pt reports thinking he has std x 5 months. Also having dizziness x 1-2 weeks. Reports heavy etoh recently.

## 2016-03-24 LAB — RAPID URINE DRUG SCREEN, HOSP PERFORMED
Amphetamines: NOT DETECTED
Barbiturates: NOT DETECTED
Benzodiazepines: NOT DETECTED
COCAINE: NOT DETECTED
OPIATES: NOT DETECTED
Tetrahydrocannabinol: NOT DETECTED

## 2016-03-24 LAB — ETHANOL: Alcohol, Ethyl (B): <5 mg/dL (ref <5)

## 2016-03-24 LAB — GC/CHLAMYDIA PROBE AMP (~~LOC~~) NOT AT ARMC
CHLAMYDIA, DNA PROBE: NEGATIVE
NEISSERIA GONORRHEA: NEGATIVE

## 2016-03-24 MED ORDER — STERILE WATER FOR INJECTION IJ SOLN
INTRAMUSCULAR | Status: AC
  Administered 2016-03-24: 2 mL via INTRAMUSCULAR
  Filled 2016-03-24: qty 10

## 2016-03-24 MED ORDER — HYDROCHLOROTHIAZIDE 25 MG PO TABS: 25.0000 mg | ORAL_TABLET | Freq: Every day | ORAL | 3 refills | Status: DC

## 2016-03-24 MED ORDER — AZITHROMYCIN 250 MG PO TABS
1000.0000 mg | ORAL_TABLET | Freq: Once | ORAL | Status: AC
  Administered 2016-03-24: 1000 mg via ORAL
  Filled 2016-03-24: qty 4

## 2016-03-24 MED ORDER — STERILE WATER FOR INJECTION IJ SOLN
0.9000 mL | Freq: Once | INTRAMUSCULAR | Status: AC
  Administered 2016-03-24: 2 mL via INTRAMUSCULAR

## 2016-03-24 MED ORDER — HYDROCHLOROTHIAZIDE 25 MG PO TABS: 25.0000 mg | ORAL_TABLET | Freq: Every day | ORAL | 0 refills | Status: DC

## 2016-03-24 MED ORDER — CEFTRIAXONE SODIUM 250 MG IJ SOLR
250.0000 mg | Freq: Once | INTRAMUSCULAR | Status: AC
  Administered 2016-03-24: 250 mg via INTRAMUSCULAR
  Filled 2016-03-24: qty 250

## 2016-03-24 MED ORDER — SODIUM CHLORIDE 0.9 % IV BOLUS (SEPSIS)
1000.0000 mL | Freq: Once | INTRAVENOUS | Status: AC
  Administered 2016-03-24: 1000 mL via INTRAVENOUS

## 2016-03-24 MED ORDER — LISINOPRIL 20 MG PO TABS: 20.0000 mg | ORAL_TABLET | Freq: Every day | ORAL | 0 refills | Status: DC

## 2016-03-24 NOTE — ED Notes (Signed)
Pt left at this time with all belongings.  

## 2016-03-24 NOTE — ED Provider Notes (Signed)
[facility] DEPT Provider Note   CSN: [id number] Arrival date & time: 03/23/16  1624   By signing my name below, I, Cameron Wise, attest that this documentation has been prepared under the direction and in the presence of Cameron Wise Cameron Jantz, MD . Electronically Signed: Christel MormonMatthew Wise, Scribe. 03/24/2016. 12:53 AM.   History   Chief Complaint Chief Complaint  Patient presents with  . Exposure to STD  . Dizziness     The history is provided by the patient. No language interpreter was used.    HPI Comments:  Cameron Wise is a 35 y.o. male with Hx of HTN, chlamydia and herpes who presents to the Emergency Department complaining of constant, unchanged penile discharge that began appearing 5 months ago. Pt notes that within the last 5 months he had more than 1 sexual partner and has not used condom every time. He note having a hx of chlamydia in the past and his symptoms are similar. Pt has been intermittently experiencing dizziness and disorientation for ~ 2 weeks. Pt says he is non-compliant with any medication for his HTN. He states his symptoms started while driving. He describes his dizziness as off balance and he says he feels confused, noting that "I don't know how to think or react". Pt notes that he drinks liquor to relax with little relief. Pt states he does not drink ETOH daily but has been consuming ETOH more than normal recently.    Past Medical History:  Diagnosis Date  . Hypertension     There are no active problems to display for this patient.   History reviewed. No pertinent surgical history.     Home Medications    Prior to Admission medications   Medication Sig Start Date End Date Taking? Authorizing Provider  hydrochlorothiazide (HYDRODIURIL) 25 MG tablet Take 1 tablet (25 mg total) by mouth daily. 03/24/16   Cameron Wise Reynalda Canny, MD  hydrochlorothiazide (HYDRODIURIL) 25 MG tablet Take 1 tablet (25 mg total) by mouth daily. 03/24/16   Cameron Wise Saramarie Stinger, MD   lisinopril (PRINIVIL,ZESTRIL) 20 MG tablet Take 1 tablet (20 mg total) by mouth daily. 03/24/16   Cameron Wise Chelisa Hennen, MD    Family History History reviewed. No pertinent family history.  Social History Social History  Substance Use Topics  . Smoking status: Never Smoker  . Smokeless tobacco: Not on file  . Alcohol use No     Allergies   Review of patient's allergies indicates no known allergies.   Review of Systems Review of Systems  Constitutional: Negative for fever.  Respiratory: Negative for shortness of breath.   Cardiovascular: Negative for chest pain.  Genitourinary: Positive for discharge.  Neurological: Positive for dizziness. Negative for weakness.  Psychiatric/Behavioral: Positive for confusion.  All other systems reviewed and are negative.    Physical Exam Updated Vital Signs BP (!) 172/124   Pulse 69   Temp 98.6 Wise (37 C) (Oral)   Resp 20   SpO2 97%   Physical Exam  Constitutional: He is oriented to person, place, and time. He appears well-developed and well-nourished. No distress.  HENT:  Head: Normocephalic and atraumatic.  Eyes: Pupils are equal, round, and reactive to light.  Pupils 5 mm reactive bilaterally  Cardiovascular: Normal rate, regular rhythm and normal heart sounds.   No murmur heard. Pulmonary/Chest: Effort normal and breath sounds normal. No respiratory distress. He has no wheezes.  Abdominal: Soft. Bowel sounds are normal. There is no tenderness. There is no rebound.  Musculoskeletal: He exhibits  no edema.  Neurological: He is alert and oriented to person, place, and time.  5 out of 5 strength in all 4 studies, no dysmetria to finger-nose-finger, cranial nerves II through XII intact  Skin: Skin is warm and dry.  Psychiatric: He has a normal mood and affect.  Anxious  Nursing note and vitals reviewed.    ED Treatments / Results  DIAGNOSTIC STUDIES:  Oxygen Saturation is 97% on room air, normal by my interpretation.     COORDINATION OF CARE:  12:44 AM Discussed treatment plan with pt at bedside and pt agreed to plan.  Labs (all labs ordered are listed, but only abnormal results are displayed) Labs Reviewed  URINALYSIS, ROUTINE W REFLEX MICROSCOPIC (NOT AT [facility]) - Abnormal; Notable for the following:       Result Value   Color, Urine AMBER (*)    Specific Gravity, Urine 1.031 (*)    Bilirubin Urine SMALL (*)    All other components within normal limits  BASIC METABOLIC PANEL  CBC  ETHANOL  URINE RAPID DRUG SCREEN, HOSP PERFORMED  GC/CHLAMYDIA PROBE AMP ([place]) NOT AT [facility]    EKG  EKG Interpretation  Date/Time:  Wednesday March 23 2016 17:45:17 EDT Ventricular Rate:  77 PR Interval:  136 QRS Duration: 92 QT Interval:  372 QTC Calculation: 420 R Axis:   20 Text Interpretation:  Normal sinus rhythm Minimal voltage criteria for LVH, may be normal variant Abnormal ECG Confirmed by Thecla Forgione  MD, Edwinna Rochette ([id number]) on 03/24/2016 1:49:39 AM       Radiology No results found.  Procedures Procedures (including critical care time)  Medications Ordered in ED Medications  sodium chloride 0.9 % bolus 1,000 mL (0 mLs Intravenous Stopped 03/24/16 0154)  cefTRIAXone (ROCEPHIN) injection 250 mg (250 mg Intramuscular Given 03/24/16 0113)  azithromycin (ZITHROMAX) tablet 1,000 mg (1,000 mg Oral Given 03/24/16 0111)  sterile water (preservative free) injection 0.9 mL (2 mLs Injection Given 03/24/16 0113)     Initial Impression / Assessment and Plan / ED Course  I have reviewed the triage vital signs and the nursing notes.  Pertinent labs & imaging results that were available during my care of the patient were reviewed by me and considered in my medical decision making (see chart for details).  Clinical Course    Patient presents with multiple complaints including dizziness, confusion, concern for STDs. He does not have a primary physician. He is in no acute distress. He was noted to be  hypertensive. He is nonfocal. He is supposed to be on HCTZ. Lab work is reassuring. Patient was given fluids; however, he was found to have disconnected this as "it makes me nervous."  EKG shows no evidence of arrhythmia. Workup is largely reassuring. He was empirically treated for STDs. Discussed with patient that he needs to establish primary care. He seems. Anxious about his symptoms. They appear more chronic in nature and there does not appear to be an acute emergent process. Patient was discharged on HCTZ and lisinopril with referral to [facility].  After history, exam, and medical workup I feel the patient has been appropriately medically screened and is safe for discharge home. Pertinent diagnoses were discussed with the patient. Patient was given return precautions.   Final Clinical Impressions(s) / ED Diagnoses   Final diagnoses:  Dizziness  Essential hypertension  Concern about STD in male without diagnosis    New Prescriptions Discharge Medication List as of 03/24/2016  2:35 AM    START taking these  medications   Details  lisinopril (PRINIVIL,ZESTRIL) 20 MG tablet Take 1 tablet (20 mg total) by mouth daily., Starting Thu 03/24/2016, Print       I personally performed the services described in this documentation, which was scribed in my presence. The recorded information has been reviewed and is accurate.     Cameron Baton, MD 03/24/16 [phone]

## 2016-03-24 NOTE — Discharge Instructions (Signed)
ED to establish primary care. You'll be discharged home with blood pressure medications. He should abstain from sexual activity for the next 10 days. He should use condoms consistently.

## 2016-06-24 IMAGING — DX DG CHEST 2V
2 series · 2 of 2 positions shown · non-contrast
Comparison: 10/27/2010

CLINICAL DATA: Chest pain and shortness of Breath

EXAM:
CHEST - 2 VIEW

[chest pa]
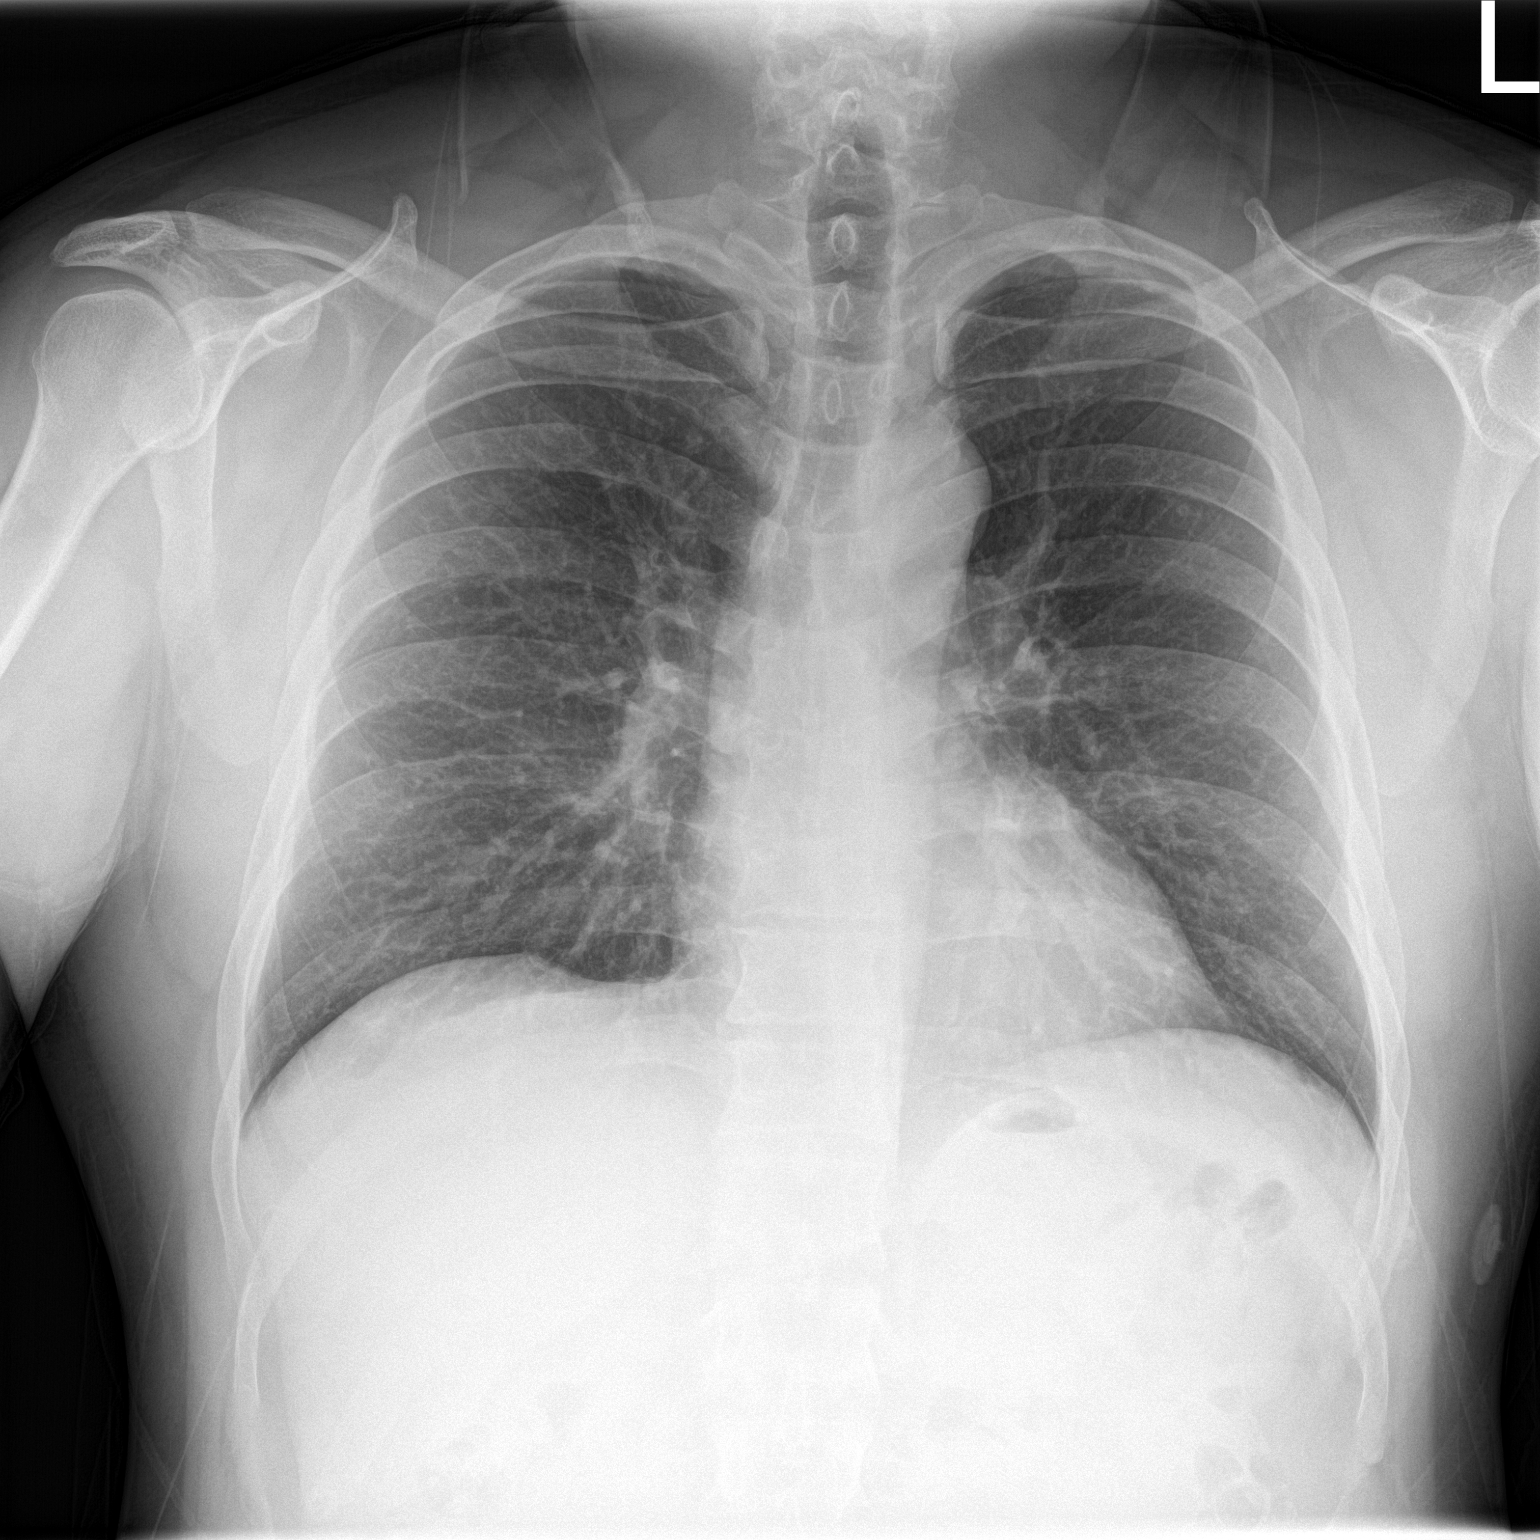

[chest lat]
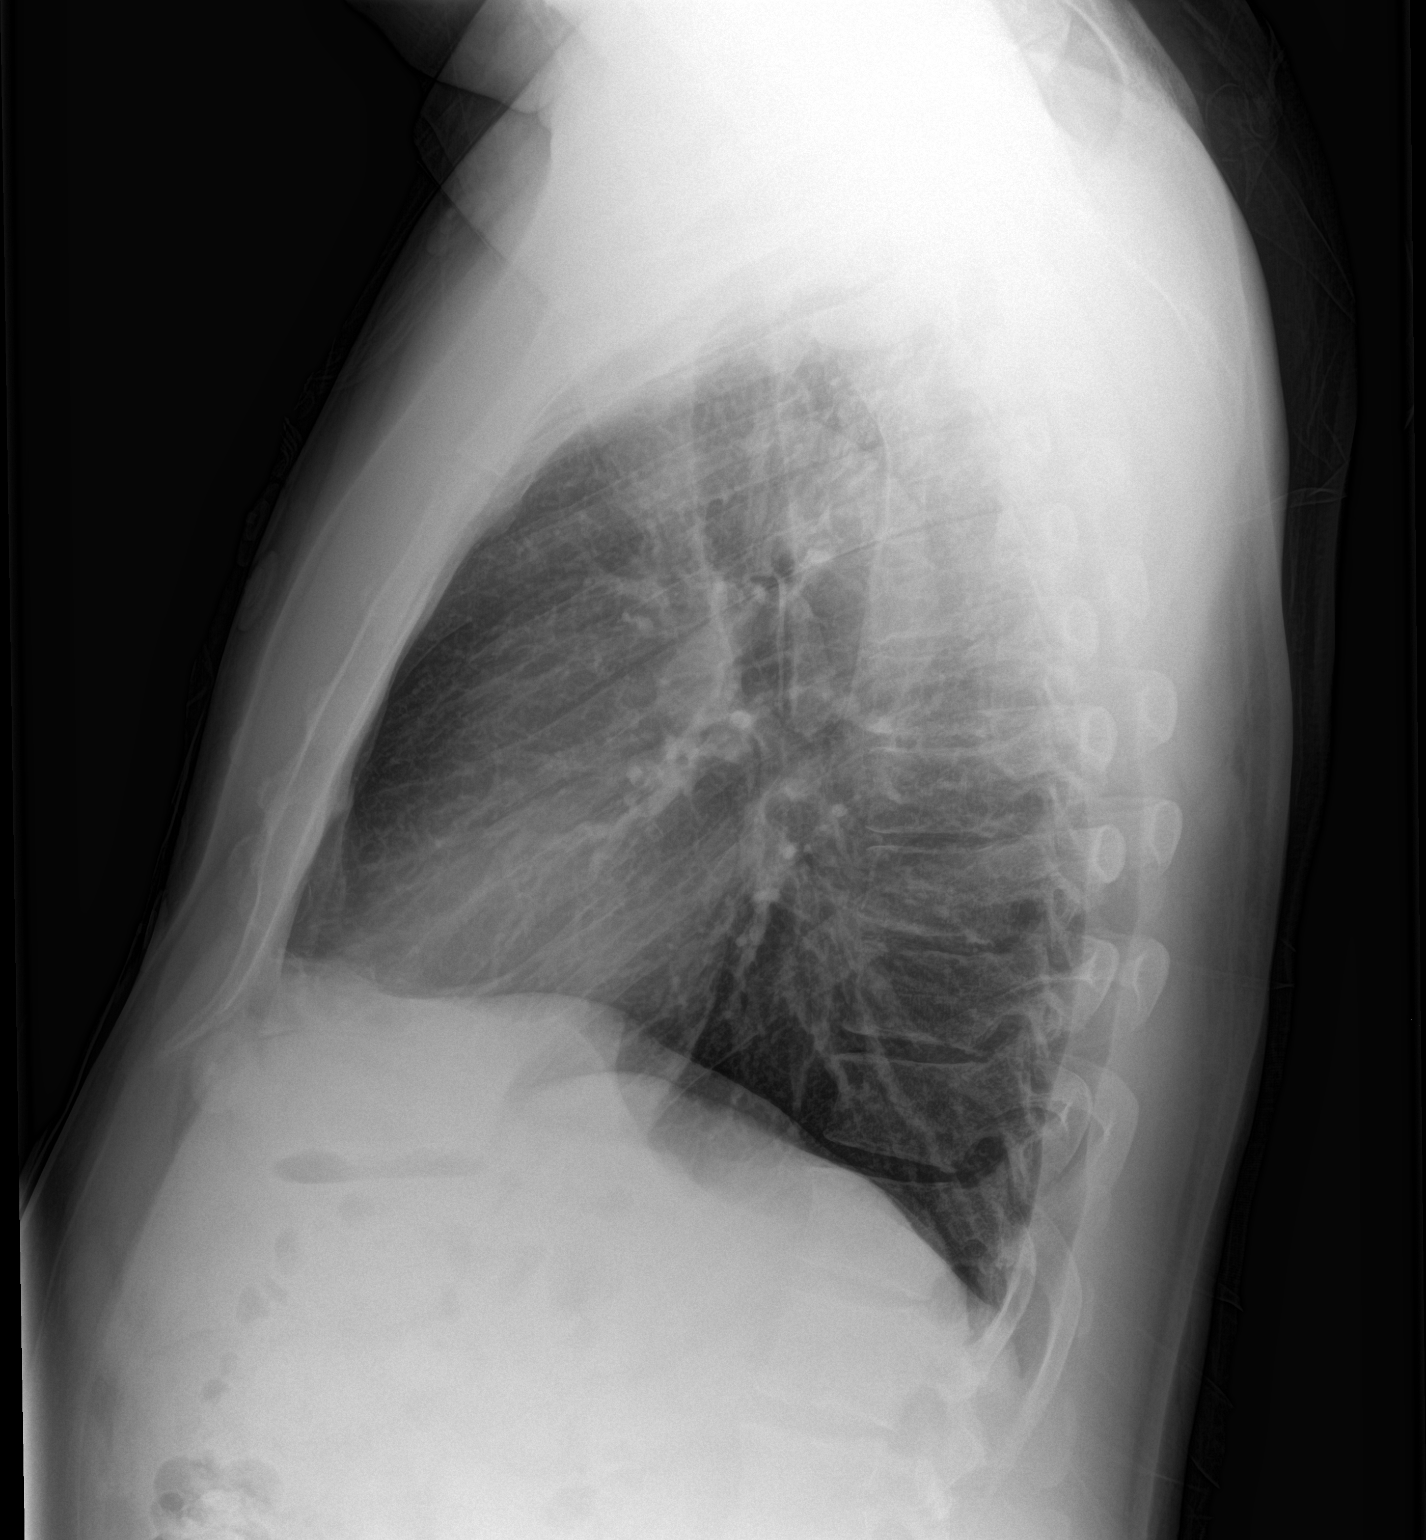

[2 of 2 positions shown; findings below may reference images not displayed]

FINDINGS: The heart size and mediastinal contours are within normal limits.
Both lungs are clear. The visualized skeletal structures are
unremarkable.
IMPRESSION: No active disease.

## 2016-08-10 ENCOUNTER — Emergency Department (HOSPITAL_COMMUNITY)
Admission: EM | Admit: 2016-08-10 | Discharge: 2016-08-10 | Disposition: A | Discharge status: Home / Self Care | Payer: Self-pay | Attending: Emergency Medicine | Admitting: Emergency Medicine

## 2016-08-10 ENCOUNTER — Encounter (HOSPITAL_COMMUNITY): Payer: Self-pay | Admitting: Emergency Medicine

## 2016-08-10 DIAGNOSIS — N342 Other urethritis: Secondary | ICD-10-CM | POA: Insufficient documentation

## 2016-08-10 DIAGNOSIS — R369 Urethral discharge, unspecified: Secondary | ICD-10-CM

## 2016-08-10 DIAGNOSIS — I10 Essential (primary) hypertension: Secondary | ICD-10-CM | POA: Insufficient documentation

## 2016-08-10 MED ORDER — LIDOCAINE HCL (PF) 1 % IJ SOLN
INTRAMUSCULAR | Status: AC
  Administered 2016-08-10: 2.1 mL
  Filled 2016-08-10: qty 5

## 2016-08-10 MED ORDER — DOXYCYCLINE HYCLATE 100 MG PO CAPS: 100.0000 mg | ORAL_CAPSULE | Freq: Two times a day (BID) | ORAL | 0 refills | Status: DC

## 2016-08-10 MED ORDER — CEFTRIAXONE SODIUM 250 MG IJ SOLR
250.0000 mg | Freq: Once | INTRAMUSCULAR | Status: AC
  Administered 2016-08-10: 250 mg via INTRAMUSCULAR
  Filled 2016-08-10: qty 250

## 2016-08-10 NOTE — ED Triage Notes (Signed)
Pt presents to ED for assessment of burning with urination, frequency and a clear discharge.  Pt sts symptoms have been going on for 4 or 5 months.

## 2016-08-10 NOTE — ED Provider Notes (Signed)
[facility] Provider Note   CSN: [id number] Arrival date & time: 08/10/16  1859  By signing my name below, I, Teofilo PodMatthew P. Jamison, attest that this documentation has been prepared under the direction and in the presence of Kerrie BuffaloHope Neese, NP. Electronically Signed: Teofilo PodMatthew P. Jamison, ED Scribe. 08/10/2016. 8:39 PM.    History   Chief Complaint Chief Complaint  Patient presents with  . Penile Discharge    The history is provided by the patient. No language interpreter was used.  Penile Discharge  This is a recurrent problem. The current episode started more than 1 week ago. The problem has not changed since onset.Associated symptoms include headaches. Pertinent negatives include no chest pain and no shortness of breath. Nothing aggravates the symptoms. Nothing relieves the symptoms. He has tried nothing for the symptoms.   HPI Comments:  Cameron Wise is a 36 y.o. male with PMHx of chlamydia and herpes who presents to the Emergency Department complaining of persistent penile discharge x 4-5 months. Pt complains of associated dysuria, headache. Pt has seen on 03/24/16 for the same and was treated with lisinopril, and states that it not provide complete relief. Pt reports that the discharge went away briefly, he had sex with a male and then the discharge came back 7-10 days later. Pt states that he masturbated during this time frame but did not have sex. Pt states that he has had 3 different male partners recently that were just one time flings and states that he did not use a condom. Pt also reports an area of pain and swelling on his scrotum that he states he has drained previously with purulent and bloody drainage. Pt states that [place] 2 days and wanted to get "clean". No alleviating factors noted. Pt denies other associated symptoms.   Past Medical History:  Diagnosis Date  . Hypertension     There are no active problems to display for this  patient.   History reviewed. No pertinent surgical history.     Home Medications    Prior to Admission medications   Medication Sig Start Date End Date Taking? Authorizing Provider  doxycycline (VIBRAMYCIN) 100 MG capsule Take 1 capsule (100 mg total) by mouth 2 (two) times daily. 08/10/16   Hope Orlene OchM Neese, NP  hydrochlorothiazide (HYDRODIURIL) 25 MG tablet Take 1 tablet (25 mg total) by mouth daily. 03/24/16   Shon Batonourtney F Horton, MD  hydrochlorothiazide (HYDRODIURIL) 25 MG tablet Take 1 tablet (25 mg total) by mouth daily. 03/24/16   Shon Batonourtney F Horton, MD  lisinopril (PRINIVIL,ZESTRIL) 20 MG tablet Take 1 tablet (20 mg total) by mouth daily. 03/24/16   Shon Batonourtney F Horton, MD    Family History History reviewed. No pertinent family history.  Social History Social History  Substance Use Topics  . Smoking status: Never Smoker  . Smokeless tobacco: Never Used  . Alcohol use No     Allergies   Patient has no known allergies.   Review of Systems Review of Systems  HENT: Negative.   Respiratory: Negative for shortness of breath.   Cardiovascular: Negative for chest pain.  Gastrointestinal: Negative for nausea and vomiting.  Genitourinary: Positive for discharge and dysuria.  Musculoskeletal: Negative for back pain.  Skin: Negative for rash.  Neurological: Positive for headaches.  Psychiatric/Behavioral: The patient is not nervous/anxious.      Physical Exam Updated Vital Signs BP (!) 144/104 (BP Location: Left Arm)   Pulse 88   Temp 98.6  F (37 C) (Oral)   Resp 17   Ht 5\' 7"  (1.702 m)   Wt 86.2 kg   SpO2 98%   BMI 29.76 kg/m   Physical Exam  Constitutional: He appears well-developed and well-nourished. No distress.  HENT:  Head: Normocephalic and atraumatic.  Eyes: Conjunctivae are normal.  Cardiovascular: Normal rate.   Pulmonary/Chest: Effort normal.  Abdominal: He exhibits no distension.  Genitourinary: Uncircumcised. Discharge found.  Genitourinary Comments:  Uncircumcised male. Urethral discharge. Left-sided inguinal tenderness. There is a BB side mass palpated in the right scrotum. Patient reports the area gets larger and drains pus then goes down.   Lymphadenopathy: Inguinal adenopathy noted on the right and left side.  Neurological: He is alert.  Skin: Skin is warm and dry.  BB sized cystic area to the right scrotum.   Psychiatric: He has a normal mood and affect.  Nursing note and vitals reviewed.    ED Treatments / Results  DIAGNOSTIC STUDIES:  Oxygen Saturation is 98% on RA, normal by my interpretation.    COORDINATION OF CARE:  8:40 PM Discussed treatment plan with pt at bedside and pt agreed to plan.   Labs (all labs ordered are listed, but only abnormal results are displayed) Labs Reviewed  HIV ANTIBODY (ROUTINE TESTING)  RPR  GC/CHLAMYDIA PROBE AMP ([place]) NOT AT [facility]    Radiology No results found.  Procedures Procedures (includin g critical care time)  Medications Ordered in ED Medications  cefTRIAXone (ROCEPHIN) injection 250 mg (250 mg Intramuscular Given 08/10/16 2114)  lidocaine (PF) (XYLOCAINE) 1 % injection (2.1 mLs  Given 08/10/16 2114)     Initial Impression / Assessment and Plan / ED Course  I have reviewed the triage vital signs and the nursing notes.   Clinical Course   36 y.o. male with multiple sex partners and ureteral d/c stable for d/c without fever and does not appear toxic. Will treat with Rocephin and Doxycycline. Patient given referral to Urology if dysuria and the the area to the right scrotum continues.   Patient encouraged to take his BP medication.   Final Clinical Impressions(s) / ED Diagnoses   Final diagnoses:  Penile discharge  Urethritis    New Prescriptions Discharge Medication List as of 08/10/2016  9:08 PM    START taking these medications   Details  doxycycline (VIBRAMYCIN) 100 MG capsule Take 1 capsule (100 mg total) by mouth 2 (two) times daily., Starting Wed  08/10/2016, Print      I personally performed the services described in this documentation, which was scribed in my presence. The recorded information has been reviewed and is accurate.     [address] [place], NP 08/11/16 0157    Margarita Grizzle, MD 08/11/16 [phone]

## 2016-08-10 NOTE — ED Notes (Signed)
..  Video visit coupon no. and handout given to pt. on discharge .

## 2016-08-11 LAB — HIV ANTIBODY (ROUTINE TESTING W REFLEX): HIV Screen 4th Generation wRfx: NONREACTIVE

## 2016-08-11 LAB — GC/CHLAMYDIA PROBE AMP (~~LOC~~) NOT AT ARMC
CHLAMYDIA, DNA PROBE: NEGATIVE
NEISSERIA GONORRHEA: NEGATIVE

## 2016-08-11 LAB — RPR: RPR Ser Ql: NONREACTIVE

## 2017-02-01 ENCOUNTER — Encounter (HOSPITAL_COMMUNITY): Payer: Self-pay

## 2017-02-01 ENCOUNTER — Emergency Department (HOSPITAL_COMMUNITY)
Admission: EM | Admit: 2017-02-01 | Discharge: 2017-02-01 | Disposition: A | Discharge status: Home / Self Care | Payer: Self-pay | Attending: Emergency Medicine | Admitting: Emergency Medicine

## 2017-02-01 DIAGNOSIS — R300 Dysuria: Secondary | ICD-10-CM | POA: Insufficient documentation

## 2017-02-01 DIAGNOSIS — R369 Urethral discharge, unspecified: Secondary | ICD-10-CM

## 2017-02-01 DIAGNOSIS — Z202 Contact with and (suspected) exposure to infections with a predominantly sexual mode of transmission: Secondary | ICD-10-CM | POA: Insufficient documentation

## 2017-02-01 DIAGNOSIS — Z113 Encounter for screening for infections with a predominantly sexual mode of transmission: Secondary | ICD-10-CM | POA: Insufficient documentation

## 2017-02-01 DIAGNOSIS — I10 Essential (primary) hypertension: Secondary | ICD-10-CM | POA: Insufficient documentation

## 2017-02-01 HISTORY — DX: Chlamydial infection, unspecified: A74.9

## 2017-02-01 LAB — URINALYSIS, ROUTINE W REFLEX MICROSCOPIC
BILIRUBIN URINE: NEGATIVE
GLUCOSE, UA: NEGATIVE mg/dL
KETONES UR: NEGATIVE mg/dL
NITRITE: NEGATIVE
PROTEIN: NEGATIVE mg/dL
Specific Gravity, Urine: 1.021 (ref 1.005–1.030)
Squamous Epithelial / LPF: NONE SEEN
pH: 6 (ref 5.0–8.0)

## 2017-02-01 LAB — GC/CHLAMYDIA PROBE AMP (~~LOC~~) NOT AT ARMC
Chlamydia: POSITIVE — AB
Neisseria Gonorrhea: POSITIVE — AB

## 2017-02-01 LAB — HIV ANTIBODY (ROUTINE TESTING W REFLEX): HIV Screen 4th Generation wRfx: NONREACTIVE

## 2017-02-01 LAB — RPR: RPR: NONREACTIVE

## 2017-02-01 MED ORDER — CEFTRIAXONE SODIUM 250 MG IJ SOLR
250.0000 mg | Freq: Once | INTRAMUSCULAR | Status: AC
  Administered 2017-02-01: 250 mg via INTRAMUSCULAR
  Filled 2017-02-01: qty 250

## 2017-02-01 MED ORDER — AZITHROMYCIN 250 MG PO TABS
1000.0000 mg | ORAL_TABLET | Freq: Once | ORAL | Status: AC
  Administered 2017-02-01: 1000 mg via ORAL
  Filled 2017-02-01: qty 4

## 2017-02-01 MED ORDER — LIDOCAINE HCL (PF) 1 % IJ SOLN
INTRAMUSCULAR | Status: AC
  Administered 2017-02-01: 0.9 mL
  Filled 2017-02-01: qty 5

## 2017-02-01 NOTE — ED Notes (Signed)
Pt departed in NAD, refused use of wheelchair.  

## 2017-02-01 NOTE — ED Triage Notes (Signed)
Pt states that he is having bloody discharge coming from his penis. Pt states that he was diagnosed with chlamydia and finished ABT and 4 days later symptoms returned even though he has not had sex.

## 2017-02-01 NOTE — Discharge Instructions (Signed)
You have been treated for gonorrhea and chlamydia today given your history of painful urination and penile discharge. Your STD tests are currently pending. Follow-up on the results of these tests with the health department in 48 hours. Notify all sexual partners of their need to be tested and treated for STDs as well. Do not engage in sexual intercourse for at least one week following treatment today. Follow-up with a primary care doctor as needed. We also recommend that you restart taking your blood pressure medication.

## 2017-02-01 NOTE — ED Provider Notes (Signed)
[facility] Provider Note   CSN: [id number] Arrival date & time: 02/01/17  0108     History   Chief Complaint Chief Complaint  Patient presents with  . SEXUALLY TRANSMITTED DISEASE    HPI Cameron Wise is a 36 y.o. male.  36 year old male with a history of chlamydia and hypertension presents to the emergency department for penile discharge. He reports noticing penile discharge yesterday with associated dysuria. Symptoms have been worsening since onset. His symptoms are reminiscent of when he was diagnosed with chlamydia in 2014. He was last screened for STDs in January and covered prophylactically with Rocephin and doxycycline. GC/chlamydia tests were negative at this time. Patient reports being sexually active with 5-6 partners since last evaluated. He denies consistent use of barrier protection or condoms. He states that many of his sexual partners are often prostitutes. He is sexually active with women only. No associated fever or vomiting. Last unprotected sexual encounter was 2 days ago.   The history is provided by the patient. No language interpreter was used.    Past Medical History:  Diagnosis Date  . Chlamydia   . Hypertension     There are no active problems to display for this patient.   History reviewed. No pertinent surgical history.     Home Medications    Prior to Admission medications   Medication Sig Start Date End Date Taking? Authorizing Provider  doxycycline (VIBRAMYCIN) 100 MG capsule Take 1 capsule (100 mg total) by mouth 2 (two) times daily. 08/10/16   Janne Napoleon, NP  hydrochlorothiazide (HYDRODIURIL) 25 MG tablet Take 1 tablet (25 mg total) by mouth daily. 03/24/16   Horton, Mayer Masker, MD  hydrochlorothiazide (HYDRODIURIL) 25 MG tablet Take 1 tablet (25 mg total) by mouth daily. 03/24/16   Horton, Mayer Masker, MD  lisinopril (PRINIVIL,ZESTRIL) 20 MG tablet Take 1 tablet (20 mg total) by mouth daily. 03/24/16   Horton, Mayer Masker, MD     Family History No family history on file.  Social History Social History  Substance Use Topics  . Smoking status: Never Smoker  . Smokeless tobacco: Never Used  . Alcohol use No     Allergies   Patient has no known allergies.   Review of Systems Review of Systems Ten systems reviewed and are negative for acute change, except as noted in the HPI.    Physical Exam Updated Vital Signs BP (!) 182/120 (BP Location: Left Arm)   Pulse 89   Temp 98.6 F (37 C) (Oral)   Resp 16   SpO2 98%   Physical Exam  Constitutional: He is oriented to person, place, and time. He appears well-developed and well-nourished. No distress.  Nontoxic appearing and in NAD  HENT:  Head: Normocephalic and atraumatic.  Eyes: Conjunctivae and EOM are normal. No scleral icterus.  Neck: Normal range of motion.  Pulmonary/Chest: Effort normal. No respiratory distress.  Genitourinary: Right testis shows no tenderness. Right testis is descended. Left testis shows no tenderness. Left testis is descended. Uncircumcised. No phimosis or paraphimosis. Discharge (copious brown, yellow) found.  Genitourinary Comments: Exam chaperoned by Marquita Palms, RN  Musculoskeletal: Normal range of motion.  Lymphadenopathy: Inguinal adenopathy noted on the right (mild) side.  Neurological: He is alert and oriented to person, place, and time. He exhibits normal muscle tone. Coordination normal.  Ambulatory with steady gait.  Skin: Skin is warm and dry. No rash noted. He is not diaphoretic. No erythema. No pallor.  Psychiatric: He has a normal  mood and affect. His behavior is normal.  Nursing note and vitals reviewed.    ED Treatments / Results  Labs (all labs ordered are listed, but only abnormal results are displayed) Labs Reviewed  URINALYSIS, ROUTINE W REFLEX MICROSCOPIC - Abnormal; Notable for the following:       Result Value   APPearance CLOUDY (*)    Hgb urine dipstick MODERATE (*)    Leukocytes, UA LARGE (*)     Bacteria, UA FEW (*)    All other components within normal limits  RPR  HIV ANTIBODY (ROUTINE TESTING)  GC/CHLAMYDIA PROBE AMP ([place]) NOT AT [facility]    EKG  EKG Interpretation None       Radiology No results found.  Procedures Procedures (including critical care time)  Medications Ordered in ED Medications  cefTRIAXone (ROCEPHIN) injection 250 mg (not administered)  azithromycin (ZITHROMAX) tablet 1,000 mg (not administered)     Initial Impression / Assessment and Plan / ED Course  I have reviewed the triage vital signs and the nursing notes.  Pertinent labs & imaging results that were available during my care of the patient were reviewed by me and considered in my medical decision making (see chart for details).     Patient to be discharged with instructions to follow up with the Health Department. Discussed importance of using protection when sexually active. Patient understands that they have GC/Chlamydia cultures pending and that they will need to inform all sexual partners if results return positive. Patient has been treated prophylacticly with azithromycin and rocephin due to pts history, copious penile discharge, and pyuria on urinalysis. Return precautions discussed and provided. Patient discharged in stable condition with no unaddressed concerns.   Final Clinical Impressions(s) / ED Diagnoses   Final diagnoses:  Possible exposure to STD  Penile discharge  Dysuria  Hypertension not at goal    New Prescriptions New Prescriptions   No medications on file     Antony MaduraHumes, Kamariyah Timberlake, Cordelia Poche-C 02/01/17 0451    Dione BoozeGlick, David, MD 02/01/17 [phone]

## 2017-02-06 NOTE — ED Notes (Signed)
Pt. Called and left a message on this RN;s voicemail.  Returned the call. No answer

## 2017-02-06 NOTE — ED Notes (Signed)
STD results given to pt.

## 2017-09-09 ENCOUNTER — Emergency Department (HOSPITAL_COMMUNITY)
Admission: EM | Admit: 2017-09-09 | Discharge: 2017-09-09 | Disposition: A | Discharge status: Home / Self Care | Payer: Self-pay | Attending: Emergency Medicine | Admitting: Emergency Medicine

## 2017-09-09 ENCOUNTER — Encounter (HOSPITAL_COMMUNITY): Payer: Self-pay | Admitting: Emergency Medicine

## 2017-09-09 ENCOUNTER — Other Ambulatory Visit: Payer: Self-pay

## 2017-09-09 DIAGNOSIS — Z79899 Other long term (current) drug therapy: Secondary | ICD-10-CM | POA: Insufficient documentation

## 2017-09-09 DIAGNOSIS — Z711 Person with feared health complaint in whom no diagnosis is made: Secondary | ICD-10-CM

## 2017-09-09 DIAGNOSIS — I10 Essential (primary) hypertension: Secondary | ICD-10-CM | POA: Insufficient documentation

## 2017-09-09 DIAGNOSIS — Z202 Contact with and (suspected) exposure to infections with a predominantly sexual mode of transmission: Secondary | ICD-10-CM | POA: Insufficient documentation

## 2017-09-09 DIAGNOSIS — B309 Viral conjunctivitis, unspecified: Secondary | ICD-10-CM | POA: Insufficient documentation

## 2017-09-09 LAB — URINALYSIS, ROUTINE W REFLEX MICROSCOPIC
Bilirubin Urine: NEGATIVE
GLUCOSE, UA: NEGATIVE mg/dL
Hgb urine dipstick: NEGATIVE
KETONES UR: 5 mg/dL — AB
LEUKOCYTES UA: NEGATIVE
NITRITE: NEGATIVE
Protein, ur: NEGATIVE mg/dL
Specific Gravity, Urine: 1.03 (ref 1.005–1.030)
pH: 5 (ref 5.0–8.0)

## 2017-09-09 MED ORDER — TETRACAINE HCL 0.5 % OP SOLN
2.0000 [drp] | Freq: Once | OPHTHALMIC | Status: AC
  Administered 2017-09-09: 2 [drp] via OPHTHALMIC
  Filled 2017-09-09: qty 4

## 2017-09-09 MED ORDER — TETRACAINE HCL 0.5 % OP SOLN: 1.0000 [drp] | Freq: Once | OPHTHALMIC | Status: DC

## 2017-09-09 MED ORDER — FLUORESCEIN SODIUM 1 MG OP STRP: 1.0000 | ORAL_STRIP | Freq: Once | OPHTHALMIC | Status: DC

## 2017-09-09 MED ORDER — STERILE WATER FOR INJECTION IJ SOLN
INTRAMUSCULAR | Status: AC
  Administered 2017-09-09: 10 mL
  Filled 2017-09-09: qty 10

## 2017-09-09 MED ORDER — CEFTRIAXONE SODIUM 250 MG IJ SOLR
250.0000 mg | Freq: Once | INTRAMUSCULAR | Status: AC
Start: 2017-09-09 — End: 2017-09-09
  Administered 2017-09-09: 250 mg via INTRAMUSCULAR
  Filled 2017-09-09: qty 250

## 2017-09-09 MED ORDER — AZITHROMYCIN 250 MG PO TABS
1000.0000 mg | ORAL_TABLET | Freq: Once | ORAL | Status: AC
Start: 2017-09-09 — End: 2017-09-09
  Administered 2017-09-09: 1000 mg via ORAL
  Filled 2017-09-09: qty 4

## 2017-09-09 MED ORDER — FLUORESCEIN SODIUM 1 MG OP STRP
1.0000 | ORAL_STRIP | Freq: Once | OPHTHALMIC | Status: AC
  Administered 2017-09-09: 1 via OPHTHALMIC
  Filled 2017-09-09: qty 1

## 2017-09-09 MED ORDER — NAPHAZOLINE-PHENIRAMINE 0.025-0.3 % OP SOLN: 1.0000 [drp] | Freq: Four times a day (QID) | OPHTHALMIC | 0 refills | Status: DC | PRN

## 2017-09-09 NOTE — ED Provider Notes (Signed)
[facility] EMERGENCY DEPARTMENT Provider Note   CSN: [id number] Arrival date & time: 09/09/17  1932     History   Chief Complaint Chief Complaint  Patient presents with  . Conjunctivitis  . SEXUALLY TRANSMITTED DISEASE    HPI Cameron Wise is a 37 y.o. male who presents for evaluation of acute onset, progressively worsening bilateral eye redness for 1 week.  He states that symptoms began initially in his right eye and 2 days ago he began developing similar symptoms in his left eye.  Symptoms include erythema of the eyes and excessive tearing as well as itching of the eyes.  He denies photophobia, vision changes, fevers, or swelling of the eyelids.  He denies abnormal drainage from the eyes.  He has tried eyedrops which he states cause worsening pain and he has also taken 3 tablets of ampicillin over the course of 2 days without significant relief.  He does wear contact lenses and states that at times he wears his contacts for longer than is recommended.  He also states that prior to his symptom onset he did not sleep for a prolonged period of time.  He also presents today for evaluation of dysuria and penile discharge for 2 weeks.  He does not suspect STD exposure.  He is currently sexually active with one male partner but does not always use protection.  He denies genital lesions, swelling or redness of the penis or testes, abdominal pain, nausea, vomiting, or pain with bowel movements.  He denies hematuria, melena, or hematochezia.  The history is provided by the patient.    Past Medical History:  Diagnosis Date  . Chlamydia   . Hypertension     There are no active problems to display for this patient.   History reviewed. No pertinent surgical history.     Home Medications    Prior to Admission medications   Medication Sig Start Date End Date Taking? Authorizing Provider  doxycycline (VIBRAMYCIN) 100 MG capsule Take 1 capsule (100 mg total) by mouth 2  (two) times daily. 08/10/16   Janne NapoleonNeese, Hope M, NP  hydrochlorothiazide (HYDRODIURIL) 25 MG tablet Take 1 tablet (25 mg total) by mouth daily. 03/24/16   Horton, Mayer Maskerourtney F, MD  hydrochlorothiazide (HYDRODIURIL) 25 MG tablet Take 1 tablet (25 mg total) by mouth daily. 03/24/16   Horton, Mayer Maskerourtney F, MD  lisinopril (PRINIVIL,ZESTRIL) 20 MG tablet Take 1 tablet (20 mg total) by mouth daily. 03/24/16   Horton, Mayer Maskerourtney F, MD  naphazoline-pheniramine (NAPHCON-A) 0.025-0.3 % ophthalmic solution Place 1 drop into both eyes every 6 (six) hours as needed for eye irritation. 09/09/17   Jeanie SewerFawze, Nayleah Gamel A, PA-C    Family History History reviewed. No pertinent family history.  Social History Social History   Tobacco Use  . Smoking status: Never Smoker  . Smokeless tobacco: Never Used  Substance Use Topics  . Alcohol use: No  . Drug use: No     Allergies   Patient has no known allergies.   Review of Systems Review of Systems  Constitutional: Negative for chills and fever.  Eyes: Positive for redness and itching. Negative for photophobia, pain, discharge and visual disturbance.  Genitourinary: Positive for discharge and dysuria. Negative for flank pain, frequency, genital sores, hematuria, penile pain, penile swelling, scrotal swelling and testicular pain.     Physical Exam Updated Vital Signs BP (!) 151/112 (BP Location: Right Arm)   Pulse 78   Temp 98.3 F (36.8 C) (Oral)   Resp 16  Ht 5\' 7"  (1.702 m)   Wt 84.8 kg (187 lb)   SpO2 100%   BMI 29.29 kg/m   Physical Exam  Constitutional: He appears well-developed and well-nourished. No distress.  HENT:  Head: Normocephalic and atraumatic.  Eyes: EOM are normal. Pupils are equal, round, and reactive to light. Right eye exhibits no discharge. Left eye exhibits no discharge.  Injected conjunctival bilaterally.  No chemosis, proptosis, or consensual photophobia.  No foreign bodies noted.  No swelling of the eyelids.  No tenderness to palpation of  the orbital rim.  No pain with EOMs. Visual acuity was unable to be obtained due to patient not wearing his contacts On fluorescein stain, no corneal abrasions or ulcerations, no dendritic lesions, no rust rings, no foreign bodies.    Neck: Normal range of motion. Neck supple. No JVD present. No tracheal deviation present.  Cardiovascular: Normal rate.  Pulmonary/Chest: Effort normal.  Abdominal: He exhibits no distension.  Genitourinary:  Genitourinary Comments: Examination performed in the presence of a chaperone.  No inguinal lymphadenopathy.  No swelling, erythema, or tenderness to palpation of the penis or testicles bilaterally.  No scrotal swelling.  No genital lesions noted.  Small amount of yellow-clear drainage noted from the urethral meatus.  Musculoskeletal: He exhibits no edema.  Neurological: He is alert.  Skin: Skin is warm and dry. No erythema.  Psychiatric: He has a normal mood and affect. His behavior is normal.  Nursing note and vitals reviewed.    ED Treatments / Results  Labs (all labs ordered are listed, but only abnormal results are displayed) Labs Reviewed  URINALYSIS, ROUTINE W REFLEX MICROSCOPIC - Abnormal; Notable for the following components:      Result Value   APPearance HAZY (*)    Ketones, ur 5 (*)    All other components within normal limits  RPR  HIV ANTIBODY (ROUTINE TESTING)  GC/CHLAMYDIA PROBE AMP ([place]) NOT AT [facility]    EKG  EKG Interpretation None       Radiology No results found.  Procedures Procedures (including critical care time)  Medications Ordered in ED Medications  tetracaine (PONTOCAINE) 0.5 % ophthalmic solution 2 drop (not administered)  fluorescein ophthalmic strip 1 strip (not administered)  cefTRIAXone (ROCEPHIN) injection 250 mg (not administered)  azithromycin (ZITHROMAX) tablet 1,000 mg (not administered)     Initial Impression / Assessment and Plan / ED Course  I have reviewed the triage vital signs  and the nursing notes.  Pertinent labs & imaging results that were available during my care of the patient were reviewed by me and considered in my medical decision making (see chart for details).     Patient presentation consistent with viral conjunctivitis.  No purulent discharge, corneal abrasions, entrapment, consensual photophobia, or dendritic staining with fluorescein study.  Presentation non-concerning for iritis, bacterial conjunctivitis, corneal abrasions, or HSV.  He has been avoiding wearing his contacts started.  No antibiotics are indicated and patient will be prescribed naphazoline for itching.  Personal hygiene and frequent handwashing discussed.  Also encouraged patient to avoid wearing contacts longer than necessary and replacing contacts monthly as recommended by his ophthalmologist.  Patient advised to followup with ophthalmologist if symptoms persist or worsen in any way including vision change or purulent discharge.   He also presents today for evaluation for possible STD.  Patient is afebrile without abdominal tenderness, abdominal pain or painful bowel movements to indicate prostatitis.  No tenderness to palpation of the testes or epididymis to suggest orchitis  or epididymitis.  STD cultures obtained including HIV, syphilis, gonorrhea and chlamydia. Patient to be discharged with instructions to follow up with PCP. Discussed importance of using protection when sexually active. Pt understands that they have GC/Chlamydia cultures pending and that they will need to inform all sexual partners if results return positive. Patient has been treated prophylactically with azithromycin and Rocephin.  Will follow up with the health department for any future testing or treatment.  Discussed indications for return to the ED.  Pt verbalized understanding of and agreement with plan and is safe for discharge home at this time.  Final Clinical Impressions(s) / ED Diagnoses   Final diagnoses:  Acute  viral conjunctivitis of both eyes  Concern about STD in male without diagnosis    ED Discharge Orders        Ordered    naphazoline-pheniramine (NAPHCON-A) 0.025-0.3 % ophthalmic solution  Every 6 hours PRN     09/09/17 2204         Bennye Alm 09/09/17 2204    Wynetta Fines, MD 09/10/17 [id number]

## 2017-09-09 NOTE — Discharge Instructions (Signed)
Conjunctivitis is very contagious. Try to avoid rubbing eyes. Apply warm compresses and use prescribed eye drops as directed.  Follow-up with an ophthalmologist in the next 2 days for reevaluation of your eye redness.  Wash your hands frequently.  Return to the emergency department immediately for any concerning signs or symptoms develop such as loss of vision, fevers, or swelling of the eyelids.  [facility] to be screened for HIV in the future and for future STD concerns or screenings. This is the recommendation by the CDC for people with multiple sexual partners or hx of STDs. You have been treated for gonorrhea and chlamydia in the ER but the hospital will call you if lab is positive.

## 2017-09-09 NOTE — ED Triage Notes (Signed)
Pt presents with dysuria, penile discharge x 2.5 wks; pt suspects STD exposure; pt also wants tested for HIV; pt also presents with bilateral eye redness, irritation, and drainage; pt states he took 3 of his friends 500mg  ampicillin (over course of 2 days) before coming to ER

## 2017-09-10 LAB — RPR: RPR Ser Ql: NONREACTIVE

## 2017-09-10 LAB — HIV ANTIBODY (ROUTINE TESTING W REFLEX): HIV Screen 4th Generation wRfx: NONREACTIVE

## 2017-09-11 LAB — GC/CHLAMYDIA PROBE AMP (~~LOC~~) NOT AT ARMC
Chlamydia: NEGATIVE
NEISSERIA GONORRHEA: NEGATIVE

## 2018-01-01 ENCOUNTER — Encounter (HOSPITAL_COMMUNITY): Payer: Self-pay | Admitting: Emergency Medicine

## 2018-01-01 ENCOUNTER — Emergency Department (HOSPITAL_COMMUNITY)
Admission: EM | Admit: 2018-01-01 | Discharge: 2018-01-02 | Disposition: A | Discharge status: Home / Self Care | Payer: Self-pay | Attending: Emergency Medicine | Admitting: Emergency Medicine

## 2018-01-01 ENCOUNTER — Other Ambulatory Visit: Payer: Self-pay

## 2018-01-01 DIAGNOSIS — Z202 Contact with and (suspected) exposure to infections with a predominantly sexual mode of transmission: Secondary | ICD-10-CM | POA: Insufficient documentation

## 2018-01-01 DIAGNOSIS — R42 Dizziness and giddiness: Secondary | ICD-10-CM

## 2018-01-01 DIAGNOSIS — E860 Dehydration: Secondary | ICD-10-CM | POA: Insufficient documentation

## 2018-01-01 DIAGNOSIS — I10 Essential (primary) hypertension: Secondary | ICD-10-CM | POA: Insufficient documentation

## 2018-01-01 DIAGNOSIS — R809 Proteinuria, unspecified: Secondary | ICD-10-CM | POA: Insufficient documentation

## 2018-01-01 LAB — URINALYSIS, ROUTINE W REFLEX MICROSCOPIC
BACTERIA UA: NONE SEEN
BILIRUBIN URINE: NEGATIVE
Glucose, UA: NEGATIVE mg/dL
HGB URINE DIPSTICK: NEGATIVE
KETONES UR: NEGATIVE mg/dL
LEUKOCYTES UA: NEGATIVE
Nitrite: NEGATIVE
PROTEIN: 30 mg/dL — AB
SPECIFIC GRAVITY, URINE: 1.031 — AB (ref 1.005–1.030)
pH: 5 (ref 5.0–8.0)

## 2018-01-01 LAB — CBC
HEMATOCRIT: 45.8 % (ref 39.0–52.0)
HEMOGLOBIN: 15.9 g/dL (ref 13.0–17.0)
MCH: 31.7 pg (ref 26.0–34.0)
MCHC: 34.7 g/dL (ref 30.0–36.0)
MCV: 91.4 fL (ref 78.0–100.0)
Platelets: 283 10*3/uL (ref 150–400)
RBC: 5.01 MIL/uL (ref 4.22–5.81)
RDW: 12.9 % (ref 11.5–15.5)
WBC: 8.8 10*3/uL (ref 4.0–10.5)

## 2018-01-01 LAB — BASIC METABOLIC PANEL
ANION GAP: 11 (ref 5–15)
BUN: 14 mg/dL (ref 6–20)
CHLORIDE: 109 mmol/L (ref 101–111)
CO2: 23 mmol/L (ref 22–32)
Calcium: 9 mg/dL (ref 8.9–10.3)
Creatinine, Ser: 1.04 mg/dL (ref 0.61–1.24)
GFR calc Af Amer: >60 mL/min (ref >60)
GFR calc non Af Amer: >60 mL/min (ref >60)
GLUCOSE: 137 mg/dL — AB (ref 65–99)
POTASSIUM: 3.4 mmol/L — AB (ref 3.5–5.1)
Sodium: 143 mmol/L (ref 135–145)

## 2018-01-01 LAB — CBG MONITORING, ED: GLUCOSE-CAPILLARY: 105 mg/dL — AB (ref 65–99)

## 2018-01-01 MED ORDER — POTASSIUM CHLORIDE CRYS ER 20 MEQ PO TBCR
40.0000 meq | EXTENDED_RELEASE_TABLET | Freq: Once | ORAL | Status: AC
  Administered 2018-01-02: 40 meq via ORAL
  Filled 2018-01-01: qty 2

## 2018-01-01 MED ORDER — SODIUM CHLORIDE 0.9 % IV BOLUS
1000.0000 mL | Freq: Once | INTRAVENOUS | Status: AC
  Administered 2018-01-01: 1000 mL via INTRAVENOUS

## 2018-01-01 NOTE — ED Triage Notes (Signed)
Pt reports that he is no longer taking HTN medications.

## 2018-01-01 NOTE — ED Triage Notes (Signed)
Pt reports feeling dizziness for the last week. Pt reports having exposure to STD.

## 2018-01-01 NOTE — ED Provider Notes (Signed)
[facility]-EMERGENCY DEPT Provider Note   CSN: [id number] Arrival date & time: 01/01/18  1956     History   Chief Complaint Chief Complaint  Patient presents with  . Dizziness  . Exposure to STD    HPI Cameron Wise is a 37 y.o. male with a hx of HTN presents to the Emergency Department complaining of intermittent episodes of lightheadedness at work.  He reports this often happens during a specific meeting with his boss.  Pt reports this happened 4-5 times in the last week. Pt reports this lasts a few minutes and resolves completely.  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  Pt reports the floor he works on is not hot. Sometimes these episodes happen in meetings and sometimes when working on the floor, but always when he is standing, never when he is sitting or laying.  Pt reports he has not passed out during any of these episodes. He denies associated CP, SOB, abd, N/V/D.  He reports he is drinking 1-2 bottles of water per day, but is usually drinking Cola and [facility]. Pt denies specific aggravating or alleviating factors.  Pt denies fever, chills, headache, neck pain, chest pain, SOB, weakness, syncope.  Pt also reports increased stress at his job and also working an additional job at [employer]. Pt reports he is not taking medication for his HTN. Pt denies symptoms of dizziness now.  Pt also reports significant anxiety about possibly having an STD including HIV.   He reports he is having penile discharge that "smells like Chlamydia."  He reports intermittent hematuria and mild dysuria for several weeks.  Pt reports he has 1 male sexual partner and reported that she was not "clean."  Pt reports he was not using a condom.  He reports he has not been sexually active for the last 1 week.  He reports he is not with this partner anymore. Pt reports a hx of gonorrhea and chlamydia.  Pt reports his  last STD testing was in Feb 2019 and all testing was negative.   The history is provided by the patient and medical records. No language interpreter was used.    Past Medical History:  Diagnosis Date  . Chlamydia   . Hypertension     There are no active problems to display for this patient.   History reviewed. No pertinent surgical history.      Home Medications    Prior to Admission medications   Medication Sig Start Date End Date Taking? Authorizing Provider  hydrochlorothiazide (HYDRODIURIL) 25 MG tablet Take 1 tablet (25 mg total) by mouth daily. 01/02/18   Kore Madlock, Dahlia Client, PA-C  lisinopril (PRINIVIL,ZESTRIL) 20 MG tablet Take 1 tablet (20 mg total) by mouth daily. 01/02/18   Ahamed Hofland, Boyd Kerbs    Family History History reviewed. No pertinent family history.  Social History Social History   Tobacco Use  . Smoking status: Never Smoker  . Smokeless tobacco: Never Used  Substance Use Topics  . Alcohol use: No  . Drug use: No     Allergies   Patient has no known allergies.   Review of Systems Review of Systems  Constitutional: Negative for appetite change, diaphoresis, fatigue, fever and unexpected weight change.  HENT: Negative for mouth sores.   Eyes: Negative for visual disturbance.  Respiratory: Negative for cough, chest tightness, shortness of breath and wheezing.   Cardiovascular: Negative for chest pain.  Gastrointestinal: Negative for abdominal pain, constipation, diarrhea, nausea and vomiting.  Endocrine: Negative for polydipsia, polyphagia and polyuria.  Genitourinary: Positive for discharge. Negative for dysuria, frequency, hematuria and urgency.  Musculoskeletal: Negative for back pain and neck stiffness.  Skin: Negative for rash.  Allergic/Immunologic: Negative for immunocompromised state.  Neurological: Positive for dizziness. Negative for syncope, light-headedness and headaches.  Hematological: Does not bruise/bleed easily.    Psychiatric/Behavioral: Negative for sleep disturbance. The patient is not nervous/anxious.      Physical Exam Updated Vital Signs BP (!) 163/122 (BP Location: Left Arm)   Pulse 88   Temp 98.7 F (37.1 C) (Oral)   Resp 18   SpO2 98%   Physical Exam  Constitutional: He appears well-developed and well-nourished. No distress.  Awake, alert, nontoxic appearance  HENT:  Head: Normocephalic and atraumatic.  Mouth/Throat: Oropharynx is clear and moist. No oropharyngeal exudate.  Eyes: Conjunctivae are normal. No scleral icterus.  Neck: Normal range of motion. Neck supple.  Cardiovascular: Normal rate, regular rhythm and intact distal pulses.  Pulmonary/Chest: Effort normal and breath sounds normal. No respiratory distress. He has no wheezes.  Equal chest expansion  Abdominal: Soft. Bowel sounds are normal. He exhibits no mass. There is no tenderness. There is no rebound and no guarding. Hernia confirmed negative in the right inguinal area and confirmed negative in the left inguinal area.  Genitourinary: Testes normal and penis normal. Uncircumcised. No discharge found.  Musculoskeletal: Normal range of motion. He exhibits no edema.  Lymphadenopathy: No inguinal adenopathy noted on the right or left side.  Neurological: He is alert.  Speech is clear and goal oriented Moves extremities without ataxia  Skin: Skin is warm and dry. He is not diaphoretic.  Psychiatric: His mood appears anxious.  Pt is very anxious about his STDs  Nursing note and vitals reviewed.    ED Treatments / Results  Labs (all labs ordered are listed, but only abnormal results are displayed) Labs Reviewed  BASIC METABOLIC PANEL - Abnormal; Notable for the following components:      Result Value   Potassium 3.4 (*)    Glucose, Bld 137 (*)    All other components within normal limits  URINALYSIS, ROUTINE W REFLEX MICROSCOPIC - Abnormal; Notable for the following components:   Specific Gravity, Urine 1.031  (*)    Protein, ur 30 (*)    All other components within normal limits  CBG MONITORING, ED - Abnormal; Notable for the following components:   Glucose-Capillary 105 (*)    All other components within normal limits  CBC  RPR  HIV ANTIBODY (ROUTINE TESTING)  GC/CHLAMYDIA PROBE AMP ([place]) NOT AT [facility]    EKG EKG Interpretation  Date/Time:  Monday Jan 01 2018 20:17:21 EDT Ventricular Rate:  94 PR Interval:    QRS Duration: 95 QT Interval:  356 QTC Calculation: 446 R Axis:   10 Text Interpretation:  Sinus rhythm RSR' in V1 or V2, right VCD or RVH Baseline wander in lead(s) V1 No significant change since last tracing Confirmed by Richardean Canal [phone]) on 01/01/2018 10:10:17 PM   Procedures Procedures (including critical care time)  Medications Ordered in ED Medications  sodium chloride 0.9 % bolus 1,000 mL (0 mLs Intravenous Stopped 01/02/18 0100)  potassium chloride SA (K-DUR,KLOR-CON) CR tablet 40 mEq (40 mEq Oral Given 01/02/18 0000)  hydrochlorothiazide (MICROZIDE) capsule 25 mg (25 mg Oral Given 01/02/18 0113)  azithromycin (ZITHROMAX) tablet 1,000 mg (1,000 mg Oral Given 01/02/18 0152)  cefTRIAXone (ROCEPHIN)  injection 250 mg (250 mg Intramuscular Given 01/02/18 0151)     Initial Impression / Assessment and Plan / ED Course  I have reviewed the triage vital signs and the nursing notes.  Pertinent labs & imaging results that were available during my care of the patient were reviewed by me and considered in my medical decision making (see chart for details).     Pt presents with concerns for possible STD.  Pt understands that they have GC/Chlamydia cultures pending and that they will need to inform all sexual partners if results return positive. Pt has been treated prophylactically with azithromycin and Rocephin due to pts history and severe anxiety.  Discussed importance of using protection when sexually active.   Additionally, patient with complaints of intermittent  lightheadedness over the last several weeks.  Patient states he is not currently lightheaded.  He has had no associated cardiac symptoms including no shortness of breath, diaphoresis, syncope.  Patient without orthostasis here in the emergency department.  Orthostatic VS for the past 24 hrs:  BP- Lying Pulse- Lying BP- Sitting Pulse- Sitting BP- Standing at 0 minutes Pulse- Standing at 0 minutes  01/02/18 0008 (!) 180/118 67 (!) 178/132 83 (!) 176/130 78    Patient is noted to be hypertensive.  He has no chest pain or shortness of breath.  No signs of hypertensive urgency.  No vision changes.  No increase in BUN or serum creatinine.  Patient's urinalysis does show increased specific gravity and some protein.  Suspect this is likely secondary to dehydration and the cause of patient's lightheadedness as this always occurs at work with standing.  Home blood pressure medications given here in the emergency department.  Discussed the importance of medication compliance and adequate hydration.  Patient is well-appearing and ambulates without difficulty here in the emergency department.  Also discussed the importance of close follow-up with his primary care physician for hypertension, proteinuria, lightheadedness.  Patient states understanding and is in agreement with the plan.   Final Clinical Impressions(s) / ED Diagnoses   Final diagnoses:  Possible exposure to STD  Hypertension, unspecified type  Lightheadedness  Dehydration  Proteinuria, unspecified type    ED Discharge Orders        Ordered    hydrochlorothiazide (HYDRODIURIL) 25 MG tablet  Daily     01/02/18 0209    lisinopril (PRINIVIL,ZESTRIL) 20 MG tablet  Daily     01/02/18 0209       Jahnia Hewes, Dahlia Client, PA-C 01/02/18 0224    Charlynne Pander, MD 01/03/18 1515

## 2018-01-01 NOTE — ED Notes (Signed)
Bed: ZO10 Expected date:  Expected time:  Means of arrival:  Comments: Lurena Nida

## 2018-01-02 LAB — HIV ANTIBODY (ROUTINE TESTING W REFLEX): HIV SCREEN 4TH GENERATION: NONREACTIVE

## 2018-01-02 LAB — GC/CHLAMYDIA PROBE AMP (~~LOC~~) NOT AT ARMC
CHLAMYDIA, DNA PROBE: NEGATIVE
NEISSERIA GONORRHEA: NEGATIVE

## 2018-01-02 LAB — RPR: RPR: NONREACTIVE

## 2018-01-02 MED ORDER — LISINOPRIL 20 MG PO TABS: 20.0000 mg | ORAL_TABLET | Freq: Every day | ORAL | 0 refills | Status: DC

## 2018-01-02 MED ORDER — AZITHROMYCIN 250 MG PO TABS
1000.0000 mg | ORAL_TABLET | Freq: Once | ORAL | Status: AC
  Administered 2018-01-02: 1000 mg via ORAL
  Filled 2018-01-02: qty 4

## 2018-01-02 MED ORDER — CEFTRIAXONE SODIUM 250 MG IJ SOLR
250.0000 mg | Freq: Once | INTRAMUSCULAR | Status: AC
  Administered 2018-01-02: 250 mg via INTRAMUSCULAR
  Filled 2018-01-02: qty 250

## 2018-01-02 MED ORDER — HYDROCHLOROTHIAZIDE 25 MG PO TABS: 25.0000 mg | ORAL_TABLET | Freq: Every day | ORAL | 0 refills | Status: AC

## 2018-01-02 MED ORDER — HYDROCHLOROTHIAZIDE 12.5 MG PO CAPS
25.0000 mg | ORAL_CAPSULE | Freq: Once | ORAL | Status: AC
  Administered 2018-01-02: 25 mg via ORAL
  Filled 2018-01-02: qty 2

## 2018-01-02 NOTE — Discharge Instructions (Addendum)
1. Medications: HCTZ, usual home medications 2. Treatment: rest, drink plenty of fluids,  3. Follow Up: Please followup with your primary doctor in 3-5 days for discussion of your diagnoses and further evaluation after today's visit; if you do not have a primary care doctor use the resource guide provided to find one; Please return to the ER for headaches, vision changes, chest pain, shortness of breath, syncope or other concerns

## 2019-02-22 ENCOUNTER — Other Ambulatory Visit: Payer: Self-pay

## 2019-02-22 ENCOUNTER — Encounter (HOSPITAL_COMMUNITY): Payer: Self-pay

## 2019-02-22 ENCOUNTER — Ambulatory Visit (HOSPITAL_COMMUNITY)
Admission: EM | Admit: 2019-02-22 | Discharge: 2019-02-22 | Disposition: A | Discharge status: Home / Self Care | Payer: 59 | Attending: Urgent Care | Admitting: Urgent Care

## 2019-02-22 DIAGNOSIS — Z760 Encounter for issue of repeat prescription: Secondary | ICD-10-CM

## 2019-02-22 DIAGNOSIS — E669 Obesity, unspecified: Secondary | ICD-10-CM

## 2019-02-22 DIAGNOSIS — I10 Essential (primary) hypertension: Secondary | ICD-10-CM | POA: Diagnosis not present

## 2019-02-22 DIAGNOSIS — R030 Elevated blood-pressure reading, without diagnosis of hypertension: Secondary | ICD-10-CM

## 2019-02-22 LAB — GLUCOSE, CAPILLARY: Glucose-Capillary: 96 mg/dL (ref 70–99)

## 2019-02-22 MED ORDER — LISINOPRIL 20 MG PO TABS: 20.0000 mg | ORAL_TABLET | Freq: Every day | ORAL | 0 refills | Status: AC

## 2019-02-22 NOTE — ED Provider Notes (Signed)
MRN: [id number] DOB: July 10, 1981  Subjective:   Cameron Wise is a 38 y.o. male presenting for medication refill.  Patient has a history of hypertension.  He has not been compliant with his medicines, stopped taking them consistently in the past 2 years and has been using them intermittently but now he is completely out of the past 2 months.  He also reports that he has been eating very unhealthily, eats a lot of fast food.  He would like to have his blood sugar checked to make sure he does not have diabetes as well.  He does not have a PCP.  Of note, patient reports that he was sent home from work yesterday and today due to headache and patient's concern about his blood pressure.  He is due to return back on Monday but depends on our clearance.  No current facility-administered medications for this encounter.   Current Outpatient Medications:  .  hydrochlorothiazide (HYDRODIURIL) 25 MG tablet, Take 1 tablet (25 mg total) by mouth daily., Disp: 30 tablet, Rfl: 0 .  lisinopril (PRINIVIL,ZESTRIL) 20 MG tablet, Take 1 tablet (20 mg total) by mouth daily., Disp: 30 tablet, Rfl: 0   No Known Allergies  Past Medical History:  Diagnosis Date  . Chlamydia   . Hypertension      History reviewed. No pertinent surgical history.  Review of Systems  Constitutional: Negative for fever and malaise/fatigue.  HENT: Negative for ear pain and sore throat.   Eyes: Negative for blurred vision and pain.  Respiratory: Negative for cough and shortness of breath.   Cardiovascular: Negative for chest pain, palpitations and leg swelling.  Gastrointestinal: Negative for abdominal pain, nausea and vomiting.  Genitourinary: Negative for hematuria.  Skin: Negative for rash.  Neurological: Positive for dizziness (Intermittent) and headaches (Mild pressure type headache over the occiput).  Psychiatric/Behavioral: Negative for depression and substance abuse. The patient is nervous/anxious.     Objective:    Vitals: BP (!) 155/111 (BP Location: Left Arm)   Pulse 70   Temp 98.7 F (37.1 C) (Oral)   Resp 17   SpO2 100%   BP Readings from Last 3 Encounters:  02/22/19 (!) 155/111  01/02/18 (!) 160/118  09/09/17 (!) 151/114   Physical Exam Constitutional:      General: He is not in acute distress.    Appearance: Normal appearance. He is well-developed. He is not ill-appearing, toxic-appearing or diaphoretic.  HENT:     Head: Normocephalic and atraumatic.     Right Ear: External ear normal.     Left Ear: External ear normal.     Nose: Nose normal.     Mouth/Throat:     Mouth: Mucous membranes are moist.     Pharynx: Oropharynx is clear.  Eyes:     General: No scleral icterus.    Extraocular Movements: Extraocular movements intact.     Pupils: Pupils are equal, round, and reactive to light.  Cardiovascular:     Rate and Rhythm: Normal rate and regular rhythm.     Heart sounds: Normal heart sounds. No murmur. No friction rub. No gallop.   Pulmonary:     Effort: Pulmonary effort is normal. No respiratory distress.     Breath sounds: Normal breath sounds. No stridor. No wheezing, rhonchi or rales.  Neurological:     General: No focal deficit present.     Mental Status: He is alert and oriented to person, place, and time.     Cranial Nerves: No cranial nerve deficit.  Motor: No weakness.     Coordination: Coordination normal.     Deep Tendon Reflexes: Reflexes normal.  Psychiatric:        Mood and Affect: Mood normal.        Behavior: Behavior normal.        Thought Content: Thought content normal.        Judgment: Judgment normal.    Results for orders placed or performed during the hospital encounter of 02/22/19 (from the past 24 hour(s))  Glucose, capillary     Status: None   Collection Time: 02/22/19  3:22 PM  Result Value Ref Range   Glucose-Capillary 96 70 - 99 mg/dL   Assessment and Plan :   1. Essential hypertension   2. Medication refill   3. Elevated blood  pressure reading   4. Obesity, unspecified classification, unspecified obesity type, unspecified whether serious comorbidity present     Patient has no neurologic deficits and was provided with medication refill for lisinopril including dosing titration instructions.  Counseled patient on management of his hypertension which includes significant dietary modifications and exercise.  I declined to refill his hydrochlorothiazide in addition to the lisinopril.  He is to establish care with a PCP. Counseled on need to establish with a PCP for further work-up and follow-up.  I placed patient in a work queue with [facility] for PCP assistance. Counseled patient on potential for adverse effects with medications prescribed/recommended today, ER and return-to-clinic precautions discussed, patient verbalized understanding.     Wallis BambergMani, Javonne Louissaint, PA-C 02/22/19 1540

## 2019-02-22 NOTE — Discharge Instructions (Addendum)
Start taking 1/2 tablet once daily for 1 week. Then increase to taking 1 full tablet daily after that until you see a primary care provider.    For elevated blood pressure, make sure you are monitoring salt in your diet.  Do not eat restaurant foods and limit processed foods at home.  Processed foods include things like frozen meals preseason meats and dinners.  Make sure your pain attention to sodium labels on foods you by at the grocery store.  For seasoning you can use a brand called Mrs. Dash which includes a lot of salt free seasonings.  Salads - kale, spinach, cabbage, spring mix; use seeds like pumpkin seeds or sunflower seeds, almonds; you can also use 1-2 hard boiled eggs in your salads Fruits - avocadoes, berries (blueberries, raspberries, blackberries), apples, oranges, pomegranate, grapefruit Vegetables - aspargus, cauliflower, broccoli, green beans, brussel spouts, bell peppers; stay away from starchy vegetables like potatoes, carrots, peas  Regarding meat it is better to eat lean meats and limit your red meat consumption including pork.  Wild caught fish, chicken breast are good options.  Do not eat any foods on this list that you are allergic to.

## 2019-02-22 NOTE — ED Triage Notes (Signed)
Patient presents to Urgent Care with complaints of dizziness and htn at work last night, pt has been out of his medicine for more than two months and needs refills. Patient reports he also needs a note to return to work.

## 2019-05-14 ENCOUNTER — Other Ambulatory Visit: Payer: Self-pay

## 2019-05-14 ENCOUNTER — Emergency Department (HOSPITAL_COMMUNITY)
Admission: EM | Admit: 2019-05-14 | Discharge: 2019-05-15 | Disposition: A | Discharge status: Home / Self Care | Payer: 59 | Attending: Emergency Medicine | Admitting: Emergency Medicine

## 2019-05-14 ENCOUNTER — Emergency Department (HOSPITAL_COMMUNITY): Payer: 59

## 2019-05-14 DIAGNOSIS — R5383 Other fatigue: Secondary | ICD-10-CM | POA: Diagnosis not present

## 2019-05-14 DIAGNOSIS — I10 Essential (primary) hypertension: Secondary | ICD-10-CM | POA: Insufficient documentation

## 2019-05-14 DIAGNOSIS — R42 Dizziness and giddiness: Secondary | ICD-10-CM | POA: Diagnosis not present

## 2019-05-14 DIAGNOSIS — R519 Headache, unspecified: Secondary | ICD-10-CM | POA: Insufficient documentation

## 2019-05-14 DIAGNOSIS — R9431 Abnormal electrocardiogram [ECG] [EKG]: Secondary | ICD-10-CM | POA: Diagnosis present

## 2019-05-14 LAB — CBC
HCT: 43 % (ref 39.0–52.0)
Hemoglobin: 14.4 g/dL (ref 13.0–17.0)
MCH: 30.8 pg (ref 26.0–34.0)
MCHC: 33.5 g/dL (ref 30.0–36.0)
MCV: 91.9 fL (ref 80.0–100.0)
Platelets: 320 10*3/uL (ref 150–400)
RBC: 4.68 MIL/uL (ref 4.22–5.81)
RDW: 12.4 % (ref 11.5–15.5)
WBC: 8.3 10*3/uL (ref 4.0–10.5)
nRBC: 0 % (ref 0.0–0.2)

## 2019-05-14 LAB — BASIC METABOLIC PANEL
Anion gap: 10 (ref 5–15)
BUN: 13 mg/dL (ref 6–20)
CO2: 23 mmol/L (ref 22–32)
Calcium: 9.1 mg/dL (ref 8.9–10.3)
Chloride: 106 mmol/L (ref 98–111)
Creatinine, Ser: 1.15 mg/dL (ref 0.61–1.24)
GFR calc Af Amer: >60 mL/min (ref >60)
GFR calc non Af Amer: >60 mL/min (ref >60)
Glucose, Bld: 126 mg/dL — ABNORMAL HIGH (ref 70–99)
Potassium: 3.5 mmol/L (ref 3.5–5.1)
Sodium: 139 mmol/L (ref 135–145)

## 2019-05-14 LAB — TROPONIN I (HIGH SENSITIVITY): Troponin I (High Sensitivity): 7 ng/L (ref <18)

## 2019-05-14 MED ORDER — SODIUM CHLORIDE 0.9% FLUSH: 3.0000 mL | Freq: Once | INTRAVENOUS | Status: DC

## 2019-05-14 NOTE — ED Triage Notes (Signed)
Pt states he had an abnormal EKG earlier today.  He is wearing a cardiac monitor.  Pt states he has been exposed to [place] and is concerned.

## 2019-05-15 LAB — TROPONIN I (HIGH SENSITIVITY): Troponin I (High Sensitivity): 6 ng/L (ref <18)

## 2019-05-15 NOTE — ED Notes (Signed)
Pt has a returned

## 2019-05-15 NOTE — ED Provider Notes (Signed)
Emergency Department Provider Note   I have reviewed the triage vital signs and the nursing notes.   HISTORY  Chief Complaint abnormal EKG   HPI Cameron Wise is a 39 y.o. male with PMH of HTN presents to the emergency department after being referred from urgent care for abnormal EKG.  Patient states he arrived to urgent care feeling some dizziness with mild headache.  He was concerned primarily about COVID-19 infection.  He states that his brother developed symptoms 2 days ago and has been tested but is awaiting results.  He was tested for COVID-19 with results expected in 3 to 4 days.  They performed an EKG which they interpreted to be abnormal and sent him to the emergency department for further evaluation.  He denies any chest pain, shortness of breath, heart palpitations.  No active symptoms.  He has been waiting overnight in the emergency department waiting room and states he is fatigued from that but is overall feeling well.  He denies any sore throat, anosmia, abdominal pain, or GI symptoms.   Past Medical History:  Diagnosis Date   Chlamydia    Hypertension     There are no active problems to display for this patient.   No past surgical history on file.  Allergies Patient has no known allergies.  Family History  Problem Relation Age of Onset   Hypertension Mother    Diabetes Father     Social History Social History   Tobacco Use   Smoking status: Never Smoker   Smokeless tobacco: Never Used  Substance Use Topics   Alcohol use: No   Drug use: No    Review of Systems  Constitutional: No fever/chills. Positive lightheadedness.  Eyes: No visual changes. ENT: No sore throat. Cardiovascular: Denies chest pain. Respiratory: Denies shortness of breath. Gastrointestinal: No abdominal pain.  No nausea, no vomiting.  No diarrhea.  No constipation. Genitourinary: Negative for dysuria. Musculoskeletal: Negative for back pain. Skin: Negative for  rash. Neurological: Negative for focal weakness or numbness. Positive HA.   10-point ROS otherwise negative.  ____________________________________________   PHYSICAL EXAM:  VITAL SIGNS: ED Triage Vitals  Enc Vitals Group     BP 05/14/19 2214 (!) 158/93     Pulse Rate 05/14/19 2214 87     Resp 05/14/19 2214 20     Temp 05/14/19 2214 98.6 F (37 C)     Temp Source 05/14/19 2214 Oral     SpO2 05/14/19 2214 99 %   Constitutional: Alert and oriented. Well appearing and in no acute distress. Eyes: Conjunctivae are normal. Head: Atraumatic. Nose: No congestion/rhinnorhea. Mouth/Throat: Mucous membranes are moist. Neck: No stridor. Cardiovascular: Normal rate, regular rhythm. Respiratory: Normal respiratory effort. Gastrointestinal: No distention.  Musculoskeletal: No gross deformities of extremities. Neurologic:  Normal speech and language. Skin:  Skin is warm, dry and intact. No rash noted.  ____________________________________________   LABS (all labs ordered are listed, but only abnormal results are displayed)  Labs Reviewed  BASIC METABOLIC PANEL - Abnormal; Notable for the following components:      Result Value   Glucose, Bld 126 (*)    All other components within normal limits  CBC  TROPONIN I (HIGH SENSITIVITY)  TROPONIN I (HIGH SENSITIVITY)   ____________________________________________  EKG   EKG Interpretation  Date/Time:  Tuesday May 14 2019 22:18:37 EDT Ventricular Rate:  83 PR Interval:  144 QRS Duration: 94 QT Interval:  356 QTC Calculation: 418 R Axis:   44 Text Interpretation:  Normal sinus rhythm Normal ECG No significant change since last tracing Confirmed by Ward, Cyril Mourning [phone]) on 05/15/2019 5:47:48 AM       ____________________________________________  RADIOLOGY  Dg Chest 2 View  Result Date: 05/14/2019 CLINICAL DATA:  Chest pain EXAM: CHEST - 2 VIEW COMPARISON:  05/19/2015 FINDINGS: The heart size and mediastinal contours are  within normal limits. Both lungs are clear. The visualized skeletal structures are unremarkable. Electronic device over the lower anterior chest wall. IMPRESSION: No active cardiopulmonary disease. Electronically Signed   By: Donavan Foil M.D.   On: 05/14/2019 23:09    ____________________________________________   PROCEDURES  Procedure(s) performed:   Procedures  None  ____________________________________________   INITIAL IMPRESSION / ASSESSMENT AND PLAN / ED COURSE  Pertinent labs & imaging results that were available during my care of the patient were reviewed by me and considered in my medical decision making (see chart for details).   Patient presents to the emergency department for consideration of abnormal EKG from urgent care.  In comparison with his prior tracings this is similar to prior.  His vital signs show mild hypertension with no other acute abnormalities.  Lab work performed in triage shows negative troponin x2.  My suspicion for PE, ACS, dissection, other cardiac emergency is extremely low.  He is wearing a Holter monitor and followed by cardiology with his lightheadedness.  He was tested for COVID-19 at urgent care and will be following along with this result.  I encouraged him to quarantine at home and remain in self isolation until his test results and he is feeling better for at least 3 days.    ____________________________________________  FINAL CLINICAL IMPRESSION(S) / ED DIAGNOSES  Final diagnoses:  Lightheadedness    MEDICATIONS GIVEN DURING THIS VISIT:  Medications  sodium chloride flush (NS) 0.9 % injection 3 mL (has no administration in time range)   Note:  This document was prepared using Dragon voice recognition software and may include unintentional dictation errors.  Nanda Quinton, MD, [facility] Emergency Medicine    Jessyka Austria, Wonda Olds, MD 05/15/19 [phone]

## 2019-05-15 NOTE — ED Notes (Signed)
Pt has left and not returned

## 2019-05-15 NOTE — Discharge Instructions (Signed)
Person Under Monitoring Name: Cameron Wise  Location: [place] [address]   Infection Prevention Recommendations for Individuals Confirmed to have, or Being Evaluated for, 2019 Novel Coronavirus (COVID-19) Infection Who Receive Care at Home  Individuals who are confirmed to have, or are being evaluated for, COVID-19 should follow the prevention steps below until a healthcare provider or local or state health department says they can return to normal activities.  Stay home except to get medical care You should restrict activities outside your home, except for getting medical care. Do not go to work, school, or public areas, and do not use public transportation or taxis.  Call ahead before visiting your doctor Before your medical appointment, call the healthcare provider and tell them that you have, or are being evaluated for, COVID-19 infection. This will help the healthcare providers office take steps to keep other people from getting infected. Ask your healthcare provider to call the local or state health department.  Monitor your symptoms Seek prompt medical attention if your illness is worsening (e.g., difficulty breathing). Before going to your medical appointment, call the healthcare provider and tell them that you have, or are being evaluated for, COVID-19 infection. Ask your healthcare provider to call the local or state health department.  Wear a facemask You should wear a facemask that covers your nose and mouth when you are in the same room with other people and when you visit a healthcare provider. People who live with or visit you should also wear a facemask while they are in the same room with you.  Separate yourself from other people in your home As much as possible, you should stay in a different room from other people in your home. Also, you should use a separate bathroom, if available.  Avoid sharing household items You should not  share dishes, drinking glasses, cups, eating utensils, towels, bedding, or other items with other people in your home. After using these items, you should wash them thoroughly with soap and water.  Cover your coughs and sneezes Cover your mouth and nose with a tissue when you cough or sneeze, or you can cough or sneeze into your sleeve. Throw used tissues in a lined trash can, and immediately wash your hands with soap and water for at least 20 seconds or use an alcohol-based hand rub.  Wash your [facility] your hands often and thoroughly with soap and water for at least 20 seconds. You can use an alcohol-based hand sanitizer if soap and water are not available and if your hands are not visibly dirty. Avoid touching your eyes, nose, and mouth with unwashed hands.   Prevention Steps for Caregivers and Household Members of Individuals Confirmed to have, or Being Evaluated for, COVID-19 Infection Being Cared for in the Home  If you live with, or provide care at home for, a person confirmed to have, or being evaluated for, COVID-19 infection please follow these guidelines to prevent infection:  Follow healthcare providers instructions Make sure that you understand and can help the patient follow any healthcare provider instructions for all care.  Provide for the patients basic needs You should help the patient with basic needs in the home and provide support for getting groceries, prescriptions, and other personal needs.  Monitor the patients symptoms If they are getting sicker, call his or her medical provider and tell them that the patient has, or is being evaluated for, COVID-19 infection. This will help the healthcare providers  office take steps to keep other people from getting infected. Ask the healthcare provider to call the local or state health department.  Limit the number of people who have contact with the patient If possible, have only one caregiver for the  patient. Other household members should stay in another home or place of residence. If this is not possible, they should stay in another room, or be separated from the patient as much as possible. Use a separate bathroom, if available. Restrict visitors who do not have an essential need to be in the home.  Keep older adults, very young children, and other sick people away from the patient Keep older adults, very young children, and those who have compromised immune systems or chronic health conditions away from the patient. This includes people with chronic heart, lung, or kidney conditions, diabetes, and cancer.  Ensure good ventilation Make sure that shared spaces in the home have good air flow, such as from an air conditioner or an opened window, weather permitting.  Wash your hands often Wash your hands often and thoroughly with soap and water for at least 20 seconds. You can use an alcohol based hand sanitizer if soap and water are not available and if your hands are not visibly dirty. Avoid touching your eyes, nose, and mouth with unwashed hands. Use disposable paper towels to dry your hands. If not available, use dedicated cloth towels and replace them when they become wet.  Wear a facemask and gloves Wear a disposable facemask at all times in the room and gloves when you touch or have contact with the patients blood, body fluids, and/or secretions or excretions, such as sweat, saliva, sputum, nasal mucus, vomit, urine, or feces.  Ensure the mask fits over your nose and mouth tightly, and do not touch it during use. Throw out disposable facemasks and gloves after using them. Do not reuse. Wash your hands immediately after removing your facemask and gloves. If your personal clothing becomes contaminated, carefully remove clothing and launder. Wash your hands after handling contaminated clothing. Place all used disposable facemasks, gloves, and other waste in a lined container before  disposing them with other household waste. Remove gloves and wash your hands immediately after handling these items.  Do not share dishes, glasses, or other household items with the patient Avoid sharing household items. You should not share dishes, drinking glasses, cups, eating utensils, towels, bedding, or other items with a patient who is confirmed to have, or being evaluated for, COVID-19 infection. After the person uses these items, you should wash them thoroughly with soap and water.  Wash laundry thoroughly Immediately remove and wash clothes or bedding that have blood, body fluids, and/or secretions or excretions, such as sweat, saliva, sputum, nasal mucus, vomit, urine, or feces, on them. Wear gloves when handling laundry from the patient. Read and follow directions on labels of laundry or clothing items and detergent. In general, wash and dry with the warmest temperatures recommended on the label.  Clean all areas the individual has used often Clean all touchable surfaces, such as counters, tabletops, doorknobs, bathroom fixtures, toilets, phones, keyboards, tablets, and bedside tables, every day. Also, clean any surfaces that may have blood, body fluids, and/or secretions or excretions on them. Wear gloves when cleaning surfaces the patient has come in contact with. Use a diluted bleach solution (e.g., dilute bleach with 1 part bleach and 10 parts water) or a household disinfectant with a label that says EPA-registered for coronaviruses. To make a  bleach solution at home, add 1 tablespoon of bleach to 1 quart (4 cups) of water. For a larger supply, add  cup of bleach to 1 gallon (16 cups) of water. Read labels of cleaning products and follow recommendations provided on product labels. Labels contain instructions for safe and effective use of the cleaning product including precautions you should take when applying the product, such as wearing gloves or eye protection and making sure you  have good ventilation during use of the product. Remove gloves and wash hands immediately after cleaning.  Monitor yourself for signs and symptoms of illness Caregivers and household members are considered close contacts, should monitor their health, and will be asked to limit movement outside of the home to the extent possible. Follow the monitoring steps for close contacts listed on the symptom monitoring form.   ? If you have additional questions, contact your local health department or call the epidemiologist on call at [phone] (available 24/7). ? This guidance is subject to change. For the most up-to-date guidance from [facility], please refer to their website: [email or web address]

## 2019-07-24 ENCOUNTER — Other Ambulatory Visit: Payer: Self-pay

## 2019-07-24 ENCOUNTER — Emergency Department (HOSPITAL_COMMUNITY)
Admission: EM | Admit: 2019-07-24 | Discharge: 2019-07-24 | Disposition: A | Discharge status: Home / Self Care | Payer: 59 | Attending: Emergency Medicine | Admitting: Emergency Medicine

## 2019-07-24 DIAGNOSIS — I10 Essential (primary) hypertension: Secondary | ICD-10-CM | POA: Insufficient documentation

## 2019-07-24 DIAGNOSIS — R369 Urethral discharge, unspecified: Secondary | ICD-10-CM

## 2019-07-24 DIAGNOSIS — Z202 Contact with and (suspected) exposure to infections with a predominantly sexual mode of transmission: Secondary | ICD-10-CM | POA: Insufficient documentation

## 2019-07-24 DIAGNOSIS — Z711 Person with feared health complaint in whom no diagnosis is made: Secondary | ICD-10-CM

## 2019-07-24 LAB — URINALYSIS, ROUTINE W REFLEX MICROSCOPIC
Bilirubin Urine: NEGATIVE
Glucose, UA: NEGATIVE mg/dL
Hgb urine dipstick: NEGATIVE
Ketones, ur: 5 mg/dL — AB
Leukocytes,Ua: NEGATIVE
Nitrite: NEGATIVE
Protein, ur: NEGATIVE mg/dL
Specific Gravity, Urine: 1.032 — ABNORMAL HIGH (ref 1.005–1.030)
pH: 5 (ref 5.0–8.0)

## 2019-07-24 MED ORDER — PENICILLIN G BENZATHINE 1200000 UNIT/2ML IM SUSP
2.4000 10*6.[IU] | Freq: Once | INTRAMUSCULAR | Status: AC
  Administered 2019-07-24: 21:00:00 2.4 10*6.[IU] via INTRAMUSCULAR
  Filled 2019-07-24: qty 4

## 2019-07-24 MED ORDER — LIDOCAINE HCL (PF) 1 % IJ SOLN
1.0000 mL | Freq: Once | INTRAMUSCULAR | Status: AC
  Administered 2019-07-24: 1 mL
  Filled 2019-07-24: qty 30

## 2019-07-24 MED ORDER — AZITHROMYCIN 250 MG PO TABS
1000.0000 mg | ORAL_TABLET | Freq: Once | ORAL | Status: AC
  Administered 2019-07-24: 1000 mg via ORAL
  Filled 2019-07-24: qty 4

## 2019-07-24 MED ORDER — DOXYCYCLINE HYCLATE 100 MG PO CAPS: 100.0000 mg | ORAL_CAPSULE | Freq: Two times a day (BID) | ORAL | 0 refills | Status: DC

## 2019-07-24 MED ORDER — CEFTRIAXONE SODIUM 250 MG IJ SOLR
250.0000 mg | Freq: Once | INTRAMUSCULAR | Status: AC
  Administered 2019-07-24: 21:00:00 250 mg via INTRAMUSCULAR
  Filled 2019-07-24: qty 250

## 2019-07-24 NOTE — ED Provider Notes (Signed)
[facility] DEPT Provider Note   CSN: [id number] Arrival date & time: 07/24/19  1919     History Chief Complaint  Patient presents with  . Penile Discharge  . Exposure to STD    Cameron Wise is a 38 y.o. male.  The history is provided by the patient and medical records. No language interpreter was used.  Penile Discharge  Exposure to STD       38 year old male with history of recurrent STI infection presenting with concerns for sexual transmitted infection.  Patient states he was treated 3 months ago for sexual transmitted infection due to having urinary discomfort as well as penile discharge.  States symptoms initially improve however it has since returned.  Endorsed burning sensation while urinating with penile discharge for the past several weeks.  He also noticed some lesions there is nontender but appears the shaft of his penis.  He admits to having 3 separate sexual partners within the past 3 months not using protection.  He denies having any fever or chills no abdominal pain no rectal pain no hematuria.  He is having an upcoming knee surgery and would like to be screened for STIs and treated prior to his surgery.  He also mention stopped taking his home blood pressure medication and cholesterol medication 3 months ago when he was concerned of drug interference from STI medication.  He endorsed occasional dizziness in which he attributed to not taking his medication.   Past Medical History:  Diagnosis Date  . Chlamydia   . Hypertension     There are no problems to display for this patient.   No past surgical history on file.     Family History  Problem Relation Age of Onset  . Hypertension Mother   . Diabetes Father     Social History   Tobacco Use  . Smoking status: Never Smoker  . Smokeless tobacco: Never Used  Substance Use Topics  . Alcohol use: No  . Drug use: No    Home Medications Prior to Admission medications     Medication Sig Start Date End Date Taking? Authorizing Provider  hydrochlorothiazide (HYDRODIURIL) 25 MG tablet Take 1 tablet (25 mg total) by mouth daily. 01/02/18   Muthersbaugh, Jarrett Soho, PA-C  lisinopril (ZESTRIL) 20 MG tablet Take 1 tablet (20 mg total) by mouth daily. 02/22/19   Jaynee Eagles, PA-C    Allergies    Patient has no known allergies.  Review of Systems   Review of Systems  Genitourinary: Positive for discharge.  All other systems reviewed and are negative.   Physical Exam Updated Vital Signs BP (!) 166/97 (BP Location: Right Arm)   Pulse 91   Temp 98.6 F (37 C) (Oral)   Resp (!) 22   Ht 5\' 7"  (1.702 m)   Wt 82.6 kg   SpO2 97%   BMI 28.51 kg/m   Physical Exam Vitals and nursing note reviewed.  Constitutional:      General: He is not in acute distress.    Appearance: He is well-developed.  HENT:     Head: Atraumatic.  Eyes:     Conjunctiva/sclera: Conjunctivae normal.  Genitourinary:    Comments: Chaperone present during exam.  No inguinal lymphadenopathy or inguinal hernia noted.  Uncircumcised penis with 3 small flesh appearing lesions noted to the dorsal shaft.  Testicle with normal appearance, normal lie, normal scrotum. Musculoskeletal:     Cervical back: Neck supple.  Skin:    Findings: No  rash.  Neurological:     Mental Status: He is alert.     ED Results / Procedures / Treatments   Labs (all labs ordered are listed, but only abnormal results are displayed) Labs Reviewed  URINALYSIS, ROUTINE W REFLEX MICROSCOPIC - Abnormal; Notable for the following components:      Result Value   Specific Gravity, Urine 1.032 (*)    Ketones, ur 5 (*)    All other components within normal limits  RPR  HIV ANTIBODY (ROUTINE TESTING W REFLEX)  GC/CHLAMYDIA PROBE AMP ([place]) NOT AT [facility]    EKG None  Radiology No results found.  Procedures Procedures (including critical care time)  Medications Ordered in ED Medications  cefTRIAXone  (ROCEPHIN) injection 250 mg (has no administration in time range)  azithromycin (ZITHROMAX) tablet 1,000 mg (has no administration in time range)  penicillin g benzathine (BICILLIN LA) 1200000 UNIT/2ML injection 2.4 Million Units (has no administration in time range)    ED Course  I have reviewed the triage vital signs and the nursing notes.  Pertinent labs & imaging results that were available during my care of the patient were reviewed by me and considered in my medical decision making (see chart for details).    MDM Rules/Calculators/A&P                      BP (!) 166/97 (BP Location: Right Arm)   Pulse 91   Temp 98.6 F (37 C) (Oral)   Resp (!) 22   Ht 5\' 7"  (1.702 m)   Wt 82.6 kg   SpO2 97%   BMI 28.51 kg/m   Final Clinical Impression(s) / ED Diagnoses Final diagnoses:  Concern about STD in male without diagnosis  Penile discharge    Rx / DC Orders ED Discharge Orders         Ordered    doxycycline (VIBRAMYCIN) 100 MG capsule  2 times daily     07/24/19 2048         Patient with history of STIs in the past, as well as risky sexual practices presenting with persistent urinary discomfort as well as penile discharge.  Will screen for STI and will provide treatment for potential chlamydia/gonorrhea infection.  He also has 3 flesh-colored lesion to the shaft of his penis.  At this time, it does not appear to be wart and does not have vesicular lesion to suggest genital herpes.  There is a concern for potential syphilitic rash therefore Bicillin 2,400,000 units given.  Rocephin and Zithromax given as well.  Recommend safe sex practice.   2049, PA-C 07/24/19 2048    2049, MD 07/25/19 0111

## 2019-07-24 NOTE — ED Triage Notes (Signed)
Pt presents to the ED c/o a foul odor and discharge from penis and burning with urination, states he thinks he was exposed to an STD from his new partner. States he is supposed to have knee surgery soon and doesn't want to be "smelly and embarrassed" when he goes in for it. States he has been taking OTC vitamins to "clean his blood" and stopped taking his HTN and cholesterol meds because he didn't want them to interact.

## 2019-07-25 LAB — RPR: RPR Ser Ql: NONREACTIVE

## 2019-07-25 LAB — HIV ANTIBODY (ROUTINE TESTING W REFLEX): HIV Screen 4th Generation wRfx: NONREACTIVE

## 2019-07-26 LAB — GC/CHLAMYDIA PROBE AMP (~~LOC~~) NOT AT ARMC
Chlamydia: NEGATIVE
Neisseria Gonorrhea: NEGATIVE

## 2020-06-09 ENCOUNTER — Other Ambulatory Visit: Payer: Self-pay

## 2020-06-09 ENCOUNTER — Other Ambulatory Visit: Payer: 59

## 2020-07-04 ENCOUNTER — Other Ambulatory Visit: Payer: Self-pay

## 2020-07-04 ENCOUNTER — Ambulatory Visit (HOSPITAL_COMMUNITY)
Admission: EM | Admit: 2020-07-04 | Discharge: 2020-07-04 | Disposition: A | Discharge status: Home / Self Care | Payer: 59 | Attending: Physician Assistant | Admitting: Physician Assistant

## 2020-07-04 ENCOUNTER — Encounter (HOSPITAL_COMMUNITY): Payer: Self-pay

## 2020-07-04 DIAGNOSIS — Z7251 High risk heterosexual behavior: Secondary | ICD-10-CM | POA: Insufficient documentation

## 2020-07-04 DIAGNOSIS — R369 Urethral discharge, unspecified: Secondary | ICD-10-CM | POA: Insufficient documentation

## 2020-07-04 DIAGNOSIS — R300 Dysuria: Secondary | ICD-10-CM | POA: Insufficient documentation

## 2020-07-04 LAB — POCT URINALYSIS DIPSTICK, ED / UC
Bilirubin Urine: NEGATIVE
Glucose, UA: NEGATIVE mg/dL
Hgb urine dipstick: NEGATIVE
Leukocytes,Ua: NEGATIVE
Nitrite: NEGATIVE
Protein, ur: NEGATIVE mg/dL
Specific Gravity, Urine: >=1.03 (ref 1.005–1.030)
Urobilinogen, UA: 0.2 mg/dL (ref 0.0–1.0)
pH: 5 (ref 5.0–8.0)

## 2020-07-04 LAB — HIV ANTIBODY (ROUTINE TESTING W REFLEX): HIV Screen 4th Generation wRfx: NONREACTIVE

## 2020-07-04 MED ORDER — METRONIDAZOLE 500 MG PO TABS: 500.0000 mg | ORAL_TABLET | Freq: Two times a day (BID) | ORAL | 0 refills | Status: AC

## 2020-07-04 MED ORDER — CEFTRIAXONE SODIUM 500 MG IJ SOLR
INTRAMUSCULAR | Status: AC
  Filled 2020-07-04: qty 500

## 2020-07-04 MED ORDER — CEFTRIAXONE SODIUM 500 MG IJ SOLR
500.0000 mg | Freq: Once | INTRAMUSCULAR | Status: AC
  Administered 2020-07-04: 500 mg via INTRAMUSCULAR

## 2020-07-04 NOTE — ED Provider Notes (Signed)
And has most [facility]    CSN: [id number] Arrival date & time: 07/04/20  1546      History   Chief Complaint Chief Complaint  Patient presents with  . Penile Discharge    HPI Cameron Wise is a 39 y.o. male presenting for 4 to 48-month history of fishy smelling urethral discharge, dysuria, and occasional blood in the urethral discharge.  Patient states that he does have unprotected intercourse with a prostitute all partners at times.  He says that he is not going to "mess around with her anymore" since he has noticed that she had no sores on her hand and think she might have given him an STD.  He has a history of chlamydia.  He denies any testicular pain or swelling.  Denies any fevers.  No pelvic pain or back pain.  Patient states that his symptoms are not getting worse but they are not going away and he is pretty sure he has an STD and needs treatment today.  He has no other concerns.  HPI  Past Medical History:  Diagnosis Date  . Chlamydia   . Hypertension     There are no problems to display for this patient.   History reviewed. No pertinent surgical history.     Home Medications    Prior to Admission medications   Medication Sig Start Date End Date Taking? Authorizing Provider  doxycycline (VIBRAMYCIN) 100 MG capsule Take 1 capsule (100 mg total) by mouth 2 (two) times daily. One po bid x 7 days 07/24/19   Fayrene Helper, PA-C  hydrochlorothiazide (HYDRODIURIL) 25 MG tablet Take 1 tablet (25 mg total) by mouth daily. 01/02/18   Muthersbaugh, Dahlia Client, PA-C  lisinopril (ZESTRIL) 20 MG tablet Take 1 tablet (20 mg total) by mouth daily. 02/22/19   Wallis Bamberg, PA-C  metroNIDAZOLE (FLAGYL) 500 MG tablet Take 1 tablet (500 mg total) by mouth 2 (two) times daily for 7 days. 07/04/20 07/11/20  Shirlee Latch, PA-C    Family History Family History  Problem Relation Age of Onset  . Hypertension Mother   . Diabetes Father     Social History Social History    Tobacco Use  . Smoking status: Never Smoker  . Smokeless tobacco: Never Used  Vaping Use  . Vaping Use: Never used  Substance Use Topics  . Alcohol use: No  . Drug use: No     Allergies   Patient has no known allergies.   Review of Systems Review of Systems  Constitutional: Negative for fever.  Gastrointestinal: Negative for abdominal pain, nausea and vomiting.  Genitourinary: Positive for discharge and dysuria. Negative for frequency, genital sores, hematuria, penile pain, penile swelling, scrotal swelling, testicular pain and urgency.  Musculoskeletal: Negative for arthralgias.  Skin: Negative for rash.  Neurological: Negative for weakness.     Physical Exam Triage Vital Signs ED Triage Vitals  Enc Vitals Group     BP 07/04/20 1626 (!) 153/104     Pulse Rate 07/04/20 1626 83     Resp 07/04/20 1626 18     Temp 07/04/20 1626 98.4 F (36.9 C)     Temp Source 07/04/20 1626 Oral     SpO2 07/04/20 1626 99 %     Weight --      Height --      Head Circumference --      Peak Flow --      Pain Score 07/04/20 1627 6     Pain Loc --  Pain Edu? --      Excl. in GC? --    No data found.  Updated Vital Signs BP (!) 153/104 (BP Location: Right Arm)   Pulse 83   Temp 98.4 F (36.9 C) (Oral)   Resp 18   SpO2 99%    Physical Exam Vitals and nursing note reviewed. Exam conducted with a chaperone present.  Constitutional:      General: He is not in acute distress.    Appearance: Normal appearance. He is well-developed and normal weight. He is not ill-appearing or toxic-appearing.  HENT:     Head: Normocephalic and atraumatic.  Eyes:     General: No scleral icterus.    Conjunctiva/sclera: Conjunctivae normal.  Cardiovascular:     Rate and Rhythm: Normal rate and regular rhythm.  Pulmonary:     Effort: Pulmonary effort is normal. No respiratory distress.  Genitourinary:    Penis: Normal and uncircumcised. No discharge.      Testes: Normal.   Musculoskeletal:     Cervical back: Neck supple.  Skin:    General: Skin is warm and dry.  Neurological:     General: No focal deficit present.     Mental Status: He is alert. Mental status is at baseline.     Motor: No weakness.     Gait: Gait normal.  Psychiatric:        Mood and Affect: Mood normal.        Behavior: Behavior normal.        Thought Content: Thought content normal.      UC Treatments / Results  Labs (all labs ordered are listed, but only abnormal results are displayed) Labs Reviewed  POCT URINALYSIS DIPSTICK, ED / UC - Abnormal; Notable for the following components:      Result Value   Ketones, ur TRACE (*)    All other components within normal limits  URINALYSIS, ROUTINE W REFLEX MICROSCOPIC  HIV ANTIBODY (ROUTINE TESTING W REFLEX)  RPR  CYTOLOGY, (ORAL, ANAL, URETHRAL) ANCILLARY ONLY    EKG   Radiology No results found.  Procedures Procedures (including critical care time)  Medications Ordered in UC Medications  cefTRIAXone (ROCEPHIN) injection 500 mg (has no administration in time range)    Initial Impression / Assessment and Plan / UC Course  I have reviewed the triage vital signs and the nursing notes.  Pertinent labs & imaging results that were available during my care of the patient were reviewed by me and considered in my medical decision making (see chart for details).   39 year old male presenting for history of dysuria and foul-smelling urethral discharge over the past several months.  He does admit to high risk sexual intercourse including unprotected sex and sex with prostitutes.  Discussed safe sex practices with patient.  Patient swabbed for GC/chlamydia/trichomonas.  Treating for all of the above with ceftriaxone in clinic.  Prescribed doxycycline and metronidazole for patient.  Patient also had RPR and HIV testing due to his high risk sex practices.  Advised patient results will be available through MyChart but someone will be in  contact with him if he test positive and the health department will need to be notified.  Advised him to follow-up with our department or PCP for any new or worsening symptoms.  Advised him not to have any sexual intercourse for 1 week after completing treatment and to notify partners.  Final Clinical Impressions(s) / UC Diagnoses   Final diagnoses:  Urethral discharge  High risk sexual  behavior, unspecified type  Dysuria     Discharge Instructions     You have been tested for gonorrhea, chlamydia, trichomonas, HIV and syphilis today.  You have been treated for gonorrhea and chlamydia.  Pick up the doxycycline at the pharmacy to treat chlamydia.  Avoid any intercourse until 1 week after you have completed treatment.  I have also sent metronidazole to the pharmacy to treat you for possible trichomonas.  Avoid any alcohol use with this medication.  Always use condoms.  Practice safe sex.    ED Prescriptions    Medication Sig Dispense Auth. Provider   metroNIDAZOLE (FLAGYL) 500 MG tablet Take 1 tablet (500 mg total) by mouth 2 (two) times daily for 7 days. 14 tablet Gareth Morgan     PDMP not reviewed this encounter.   Shirlee Latch, PA-C 07/05/20 [phone]

## 2020-07-04 NOTE — ED Triage Notes (Addendum)
Pt present penile discharge with urinating blood. Symptoms started 6 months ago. Pt states that he continues to have discharge and pain when he goes to the bathroom

## 2020-07-04 NOTE — Discharge Instructions (Signed)
You have been tested for gonorrhea, chlamydia, trichomonas, HIV and syphilis today.  You have been treated for gonorrhea and chlamydia.  Pick up the doxycycline at the pharmacy to treat chlamydia.  Avoid any intercourse until 1 week after you have completed treatment.  I have also sent metronidazole to the pharmacy to treat you for possible trichomonas.  Avoid any alcohol use with this medication.  Always use condoms.  Practice safe sex.

## 2020-07-05 LAB — RPR: RPR Ser Ql: NONREACTIVE

## 2020-07-06 LAB — CYTOLOGY, (ORAL, ANAL, URETHRAL) ANCILLARY ONLY
Chlamydia: NEGATIVE
Comment: NEGATIVE
Comment: NEGATIVE
Comment: NORMAL
Neisseria Gonorrhea: NEGATIVE
Trichomonas: NEGATIVE

## 2020-07-17 ENCOUNTER — Other Ambulatory Visit: Payer: Self-pay

## 2020-07-19 ENCOUNTER — Encounter (HOSPITAL_COMMUNITY): Payer: Self-pay

## 2020-07-19 ENCOUNTER — Ambulatory Visit (HOSPITAL_COMMUNITY)
Admission: EM | Admit: 2020-07-19 | Discharge: 2020-07-19 | Disposition: A | Discharge status: Home / Self Care | Payer: Self-pay | Attending: Internal Medicine | Admitting: Internal Medicine

## 2020-07-19 ENCOUNTER — Other Ambulatory Visit: Payer: Self-pay

## 2020-07-19 DIAGNOSIS — Z711 Person with feared health complaint in whom no diagnosis is made: Secondary | ICD-10-CM | POA: Insufficient documentation

## 2020-07-19 LAB — POCT URINALYSIS DIPSTICK, ED / UC
Bilirubin Urine: NEGATIVE
Glucose, UA: NEGATIVE mg/dL
Hgb urine dipstick: NEGATIVE
Leukocytes,Ua: NEGATIVE
Nitrite: NEGATIVE
Protein, ur: NEGATIVE mg/dL
Specific Gravity, Urine: >=1.03 (ref 1.005–1.030)
Urobilinogen, UA: 0.2 mg/dL (ref 0.0–1.0)
pH: 6 (ref 5.0–8.0)

## 2020-07-19 NOTE — Discharge Instructions (Signed)
Your testing was negative for any urinary tract infection.  We will send sample for testing of sexually transmitted infections.  We will call you if any further treatment is recommended.

## 2020-07-19 NOTE — ED Triage Notes (Signed)
Pt is here to discuss lab results. Pt states still some odor when he masturbate.  Pt is concern STD

## 2020-07-19 NOTE — ED Provider Notes (Signed)
[facility]    CSN: [id number] Arrival date & time: 07/19/20  1631   History   Chief Complaint Chief Complaint  Patient presents with  . Advice Only  . Exposure to STD    HPI Cameron Wise is a 39 y.o. male presents to urgent care with concern for STI.  Patient tested negative on 11/27 however unconvinced of results as he states his semen smells "different" when masturbating.  Patient states he has had schedule for since last testing but states it was protected.  He denies any discharge from penis, no suspicious rashes or lesions, no dysuria, no fever or chills.   Past Medical History:  Diagnosis Date  . Chlamydia   . Hypertension     There are no problems to display for this patient.   History reviewed. No pertinent surgical history.     Home Medications    Prior to Admission medications   Medication Sig Start Date End Date Taking? Authorizing Provider  doxycycline (VIBRAMYCIN) 100 MG capsule Take 1 capsule (100 mg total) by mouth 2 (two) times daily. One po bid x 7 days 07/24/19   Fayrene Helper, PA-C  hydrochlorothiazide (HYDRODIURIL) 25 MG tablet Take 1 tablet (25 mg total) by mouth daily. 01/02/18   Muthersbaugh, Dahlia Client, PA-C  lisinopril (ZESTRIL) 20 MG tablet Take 1 tablet (20 mg total) by mouth daily. 02/22/19   Wallis Bamberg, PA-C    Family History Family History  Problem Relation Age of Onset  . Hypertension Mother   . Diabetes Father     Social History Social History   Tobacco Use  . Smoking status: Never Smoker  . Smokeless tobacco: Never Used  Vaping Use  . Vaping Use: Never used  Substance Use Topics  . Alcohol use: No  . Drug use: No     Allergies   Patient has no known allergies.   Review of Systems As stated in HPI otherwise negative   Physical Exam Triage Vital Signs ED Triage Vitals [07/19/20 1654]  Enc Vitals Group     BP (!) 145/111     Pulse Rate 81     Resp 18     Temp 98.9 F (37.2 C)     Temp Source Oral      SpO2 100 %     Weight      Height      Head Circumference      Peak Flow      Pain Score      Pain Loc      Pain Edu?      Excl. in GC?    No data found.  Updated Vital Signs BP (!) 138/110 (BP Location: Left Arm)   Pulse 81   Temp 98.9 F (37.2 C) (Oral)   Resp 18   SpO2 100%   Visual Acuity Right Eye Distance:   Left Eye Distance:   Bilateral Distance:    Right Eye Near:   Left Eye Near:    Bilateral Near:     Physical Exam Constitutional:      General: He is not in acute distress.    Appearance: Normal appearance. He is not ill-appearing.  Neurological:     Mental Status: He is alert.  Psychiatric:     Comments: Normal judgment.  Patient very poor historian      UC Treatments / Results  Labs (all labs ordered are listed, but only abnormal results are displayed) Labs Reviewed  POCT URINALYSIS DIPSTICK, ED /  UC - Abnormal; Notable for the following components:      Result Value   Ketones, ur TRACE (*)    All other components within normal limits  CYTOLOGY, (ORAL, ANAL, URETHRAL) ANCILLARY ONLY    EKG   Radiology No results found.  Procedures Procedures (including critical care time)  Medications Ordered in UC Medications - No data to display  Initial Impression / Assessment and Plan / UC Course  I have reviewed the triage vital signs and the nursing notes.  Pertinent labs & imaging results that were available during my care of the patient were reviewed by me and considered in my medical decision making (see chart for details).  STI screening -will reculture and send for testing as pt has been sexually active -no tx today as pt not symptomatic. U/a negative -education given  Reviewed expections re: course of current medical issues. Questions answered. Outlined signs and symptoms indicating need for more acute intervention. Pt verbalized understanding. AVS given   Final Clinical Impressions(s) / UC Diagnoses   Final diagnoses:   Concern about STD in male without diagnosis     Discharge Instructions     Your testing was negative for any urinary tract infection.  We will send sample for testing of sexually transmitted infections.  We will call you if any further treatment is recommended.    ED Prescriptions    None     PDMP not reviewed this encounter.   Rolla Etienne, NP 07/21/20 1910

## 2020-07-20 LAB — CYTOLOGY, (ORAL, ANAL, URETHRAL) ANCILLARY ONLY
Chlamydia: NEGATIVE
Comment: NEGATIVE
Comment: NEGATIVE
Comment: NORMAL
Neisseria Gonorrhea: NEGATIVE
Trichomonas: NEGATIVE

## 2020-09-28 ENCOUNTER — Encounter (HOSPITAL_COMMUNITY): Payer: Self-pay

## 2020-11-10 ENCOUNTER — Other Ambulatory Visit: Payer: Self-pay

## 2020-11-10 ENCOUNTER — Encounter (HOSPITAL_COMMUNITY): Payer: Self-pay

## 2020-11-10 ENCOUNTER — Ambulatory Visit (HOSPITAL_COMMUNITY)
Admission: EM | Admit: 2020-11-10 | Discharge: 2020-11-10 | Disposition: A | Discharge status: Home / Self Care | Payer: BC Managed Care – PPO | Attending: Family Medicine | Admitting: Family Medicine

## 2020-11-10 DIAGNOSIS — I10 Essential (primary) hypertension: Secondary | ICD-10-CM

## 2020-11-10 DIAGNOSIS — N481 Balanitis: Secondary | ICD-10-CM

## 2020-11-10 DIAGNOSIS — Z113 Encounter for screening for infections with a predominantly sexual mode of transmission: Secondary | ICD-10-CM | POA: Diagnosis not present

## 2020-11-10 LAB — POCT URINALYSIS DIPSTICK, ED / UC
Bilirubin Urine: NEGATIVE
Glucose, UA: NEGATIVE mg/dL
Hgb urine dipstick: NEGATIVE
Ketones, ur: NEGATIVE mg/dL
Leukocytes,Ua: NEGATIVE
Nitrite: NEGATIVE
Protein, ur: NEGATIVE mg/dL
Specific Gravity, Urine: 1.025 (ref 1.005–1.030)
Urobilinogen, UA: 0.2 mg/dL (ref 0.0–1.0)
pH: 7.5 (ref 5.0–8.0)

## 2020-11-10 MED ORDER — SULFAMETHOXAZOLE-TRIMETHOPRIM 800-160 MG PO TABS: 1.0000 | ORAL_TABLET | Freq: Two times a day (BID) | ORAL | 0 refills | Status: AC

## 2020-11-10 MED ORDER — CLOTRIMAZOLE 1 % EX CREA: TOPICAL_CREAM | CUTANEOUS | 0 refills | Status: AC

## 2020-11-10 NOTE — Discharge Instructions (Addendum)
We have sent testing for sexually transmitted infections. We will notify you of any positive results once they are received. If required, we will prescribe any medications you might need.  Please refrain from all sexual activity for at least the next seven days.  Your blood pressure was noted to be elevated during your visit today. If you are currently taking medication for high blood pressure, please ensure you are taking this as directed. If you do not have a history of high blood pressure and your blood pressure remains persistently elevated, you may need to begin taking a medication at some point. You may return here within the next few days to recheck if unable to see your primary care provider or if you do not have a one.  BP (!) 169/99 (BP Location: Right Arm)   Pulse 68   Temp 98.5 F (36.9 C) (Oral)   Resp 18   SpO2 100%   BP Readings from Last 3 Encounters:  11/10/20 (!) 169/99  07/19/20 (!) 138/110  07/04/20 (!) 153/104

## 2020-11-10 NOTE — ED Provider Notes (Addendum)
[facility]   [id number] 11/10/20 Arrival Time: 1038  ASSESSMENT & PLAN:  1. Balanitis   2. Elevated blood pressure reading in office with diagnosis of hypertension   3. Screening for STDs (sexually transmitted diseases)     Begin: Meds ordered this encounter  Medications  . sulfamethoxazole-trimethoprim (BACTRIM DS) 800-160 MG tablet    Sig: Take 1 tablet by mouth 2 (two) times daily for 10 days.    Dispense:  20 tablet    Refill:  0  . clotrimazole (LOTRIMIN) 1 % cream    Sig: Apply to affected area 2 times daily for up to one week.    Dispense:  15 g    Refill:  0   Urethral cytology pending.   Discharge Instructions      We have sent testing for sexually transmitted infections. We will notify you of any positive results once they are received. If required, we will prescribe any medications you might need.  Please refrain from all sexual activity for at least the next seven days.  Your blood pressure was noted to be elevated during your visit today. If you are currently taking medication for high blood pressure, please ensure you are taking this as directed. If you do not have a history of high blood pressure and your blood pressure remains persistently elevated, you may need to begin taking a medication at some point. You may return here within the next few days to recheck if unable to see your primary care provider or if you do not have a one.  BP (!) 169/99 (BP Location: Right Arm)   Pulse 68   Temp 98.5 F (36.9 C) (Oral)   Resp 18   SpO2 100%   BP Readings from Last 3 Encounters:  11/10/20 (!) 169/99  07/19/20 (!) 138/110  07/04/20 (!) 153/104       Reviewed expectations re: course of current medical issues. Questions answered. Outlined signs and symptoms indicating need for more acute intervention. Patient verbalized understanding. After Visit Summary given.   SUBJECTIVE:  Cameron Wise is a 40 y.o. male who presents with complaint of  possible penile infection. Discharge/weeping around glans of penis; on/off over past two months. Sexually active with females; always uses condom; last sexual intercourse approx 1 w ago. No OTC tx. Afebrile. No abdominal or pelvic pain. No n/v. No rashes or lesions.  Increased blood pressure noted today. Reports that he is treated for HTN. Taking medications as directed. He reports no chest pain on exertion, no dyspnea on exertion, no swelling of ankles, no orthostatic dizziness or lightheadedness, no orthopnea or paroxysmal nocturnal dyspnea and no palpitations.   OBJECTIVE:  Vitals:   11/10/20 1204  BP: (!) 169/99  Pulse: 68  Resp: 18  Temp: 98.5 F (36.9 C)  TempSrc: Oral  SpO2: 100%     General appearance: alert, cooperative, appears stated age and no distress Throat: lips, mucosa, and tongue normal; teeth and gums normal Lungs: unlabored respirations; speaks full sentences without difficulty Back: no CVA tenderness; FROM at waist Abdomen: soft, non-tender GU: uncircumcised male with weeping/discharge around gland penis under foreskin; otherwise normal exam Skin: warm and dry Psychological: alert and cooperative; normal mood and affect.  Results for orders placed or performed during the hospital encounter of 11/10/20  POC Urinalysis dipstick  Result Value Ref Range   Glucose, UA NEGATIVE NEGATIVE mg/dL   Bilirubin Urine NEGATIVE NEGATIVE   Ketones, ur NEGATIVE NEGATIVE mg/dL   Specific Gravity, Urine 1.025  1.005 - 1.030   Hgb urine dipstick NEGATIVE NEGATIVE   pH 7.5 5.0 - 8.0   Protein, ur NEGATIVE NEGATIVE mg/dL   Urobilinogen, UA 0.2 0.0 - 1.0 mg/dL   Nitrite NEGATIVE NEGATIVE   Leukocytes,Ua NEGATIVE NEGATIVE    Labs Reviewed  POCT URINALYSIS DIPSTICK, ED / UC  CYTOLOGY, (ORAL, ANAL, URETHRAL) ANCILLARY ONLY    No Known Allergies  Past Medical History:  Diagnosis Date  . Chlamydia   . Hypertension    Family History  Problem Relation Age of Onset  .  Hypertension Mother   . Diabetes Father    Social History   Socioeconomic History  . Marital status: Divorced    Spouse name: Not on file  . Number of children: Not on file  . Years of education: Not on file  . Highest education level: Not on file  Occupational History  . Not on file  Tobacco Use  . Smoking status: Never Smoker  . Smokeless tobacco: Never Used  Vaping Use  . Vaping Use: Never used  Substance and Sexual Activity  . Alcohol use: No  . Drug use: No  . Sexual activity: Not on file  Other Topics Concern  . Not on file  Social History Narrative   ** Merged History Encounter **       ** Merged History Encounter **       Social Determinants of Health   Financial Resource Strain: Not on file  Food Insecurity: Not on file  Transportation Needs: Not on file  Physical Activity: Not on file  Stress: Not on file  Social Connections: Not on file  Intimate Partner Violence: Not on file          [place], MD 11/10/20 1304    Mardella Layman, MD 11/10/20 [phone]

## 2020-11-10 NOTE — ED Triage Notes (Signed)
Pt c/o penile discharge X 2 months. Pt states he has not been sexually active. He states he feels a burning sensation when urinating.

## 2020-11-11 LAB — CYTOLOGY, (ORAL, ANAL, URETHRAL) ANCILLARY ONLY
Chlamydia: NEGATIVE
Comment: NEGATIVE
Comment: NEGATIVE
Comment: NORMAL
Neisseria Gonorrhea: NEGATIVE
Trichomonas: NEGATIVE
